# Patient Record
Sex: Male | Born: 1965 | Race: Black or African American | Hispanic: No | Marital: Single | Smoking: Current some day smoker
Health system: Southern US, Community
[De-identification: ages and names within clinical notes are randomized; demographics above are authoritative.]

## PROBLEM LIST (undated history)

## (undated) DIAGNOSIS — I5022 Chronic systolic (congestive) heart failure: Secondary | ICD-10-CM

## (undated) DIAGNOSIS — Z720 Tobacco use: Secondary | ICD-10-CM

## (undated) DIAGNOSIS — I10 Essential (primary) hypertension: Secondary | ICD-10-CM

## (undated) DIAGNOSIS — I255 Ischemic cardiomyopathy: Secondary | ICD-10-CM

## (undated) DIAGNOSIS — E785 Hyperlipidemia, unspecified: Secondary | ICD-10-CM

## (undated) DIAGNOSIS — I2510 Atherosclerotic heart disease of native coronary artery without angina pectoris: Secondary | ICD-10-CM

## (undated) DIAGNOSIS — F1010 Alcohol abuse, uncomplicated: Secondary | ICD-10-CM

## (undated) HISTORY — DX: Chronic systolic (congestive) heart failure: I50.22

## (undated) HISTORY — PX: CORONARY ANGIOPLASTY WITH STENT PLACEMENT: SHX49

## (undated) HISTORY — DX: Essential (primary) hypertension: I10

---

## 2012-05-29 LAB — CBC
HGB: 15 g/dL (ref 13.0–18.0)
MCH: 30.5 pg (ref 26.0–34.0)
MCHC: 33.1 g/dL (ref 32.0–36.0)
Platelet: 243 10*3/uL (ref 150–440)
RDW: 14.2 % (ref 11.5–14.5)

## 2012-05-29 LAB — COMPREHENSIVE METABOLIC PANEL
Alkaline Phosphatase: 89 U/L (ref 50–136)
Anion Gap: 5 — ABNORMAL LOW (ref 7–16)
Bilirubin,Total: 0.4 mg/dL (ref 0.2–1.0)
Co2: 29 mmol/L (ref 21–32)
Creatinine: 1.12 mg/dL (ref 0.60–1.30)
EGFR (African American): >60
Osmolality: 277 (ref 275–301)
Potassium: 3.4 mmol/L — ABNORMAL LOW (ref 3.5–5.1)
SGOT(AST): 15 U/L (ref 15–37)
SGPT (ALT): 25 U/L (ref 12–78)
Sodium: 137 mmol/L (ref 136–145)
Total Protein: 8.1 g/dL (ref 6.4–8.2)

## 2012-05-29 LAB — CK TOTAL AND CKMB (NOT AT ARMC)
CK, Total: 343 U/L — ABNORMAL HIGH (ref 35–232)
CK, Total: 451 U/L — ABNORMAL HIGH (ref 35–232)
CK-MB: 0.7 ng/mL (ref 0.5–3.6)
CK-MB: 8.1 ng/mL — ABNORMAL HIGH (ref 0.5–3.6)

## 2012-05-29 LAB — DRUG SCREEN, URINE
Barbiturates, Ur Screen: NEGATIVE (ref ?–200)
Benzodiazepine, Ur Scrn: NEGATIVE (ref ?–200)
Cannabinoid 50 Ng, Ur ~~LOC~~: NEGATIVE (ref ?–50)
Methadone, Ur Screen: NEGATIVE (ref ?–300)
Opiate, Ur Screen: POSITIVE (ref ?–300)
Phencyclidine (PCP) Ur S: NEGATIVE (ref ?–25)

## 2012-05-30 ENCOUNTER — Inpatient Hospital Stay: Payer: Self-pay | Admitting: Internal Medicine

## 2012-05-30 ENCOUNTER — Inpatient Hospital Stay (HOSPITAL_COMMUNITY)
Admission: EM | Admit: 2012-05-30 | Discharge: 2012-06-01 | DRG: 247 | Disposition: A | Discharge status: Home / Self Care | Payer: MEDICAID | Source: Other Acute Inpatient Hospital | Attending: Cardiovascular Disease | Admitting: Cardiovascular Disease

## 2012-05-30 ENCOUNTER — Encounter (HOSPITAL_COMMUNITY): Payer: Self-pay | Admitting: Cardiology

## 2012-05-30 ENCOUNTER — Encounter (HOSPITAL_COMMUNITY)
Admission: EM | Disposition: A | Discharge status: Home / Self Care | Payer: Self-pay | Source: Other Acute Inpatient Hospital | Attending: Cardiovascular Disease

## 2012-05-30 DIAGNOSIS — E785 Hyperlipidemia, unspecified: Secondary | ICD-10-CM | POA: Diagnosis present

## 2012-05-30 DIAGNOSIS — I214 Non-ST elevation (NSTEMI) myocardial infarction: Principal | ICD-10-CM | POA: Diagnosis present

## 2012-05-30 DIAGNOSIS — Z720 Tobacco use: Secondary | ICD-10-CM | POA: Diagnosis present

## 2012-05-30 DIAGNOSIS — I2510 Atherosclerotic heart disease of native coronary artery without angina pectoris: Secondary | ICD-10-CM

## 2012-05-30 DIAGNOSIS — I472 Ventricular tachycardia, unspecified: Secondary | ICD-10-CM | POA: Diagnosis present

## 2012-05-30 DIAGNOSIS — I2589 Other forms of chronic ischemic heart disease: Secondary | ICD-10-CM | POA: Diagnosis present

## 2012-05-30 DIAGNOSIS — I2109 ST elevation (STEMI) myocardial infarction involving other coronary artery of anterior wall: Secondary | ICD-10-CM

## 2012-05-30 DIAGNOSIS — F17200 Nicotine dependence, unspecified, uncomplicated: Secondary | ICD-10-CM | POA: Diagnosis present

## 2012-05-30 DIAGNOSIS — F1010 Alcohol abuse, uncomplicated: Secondary | ICD-10-CM | POA: Diagnosis present

## 2012-05-30 DIAGNOSIS — Z955 Presence of coronary angioplasty implant and graft: Secondary | ICD-10-CM

## 2012-05-30 DIAGNOSIS — I369 Nonrheumatic tricuspid valve disorder, unspecified: Secondary | ICD-10-CM

## 2012-05-30 DIAGNOSIS — I4729 Other ventricular tachycardia: Secondary | ICD-10-CM | POA: Diagnosis present

## 2012-05-30 HISTORY — DX: Tobacco use: Z72.0

## 2012-05-30 HISTORY — PX: LEFT HEART CATHETERIZATION WITH CORONARY ANGIOGRAM: SHX5451

## 2012-05-30 HISTORY — DX: Hyperlipidemia, unspecified: E78.5

## 2012-05-30 HISTORY — DX: Atherosclerotic heart disease of native coronary artery without angina pectoris: I25.10

## 2012-05-30 HISTORY — DX: Alcohol abuse, uncomplicated: F10.10

## 2012-05-30 HISTORY — DX: Ischemic cardiomyopathy: I25.5

## 2012-05-30 LAB — APTT: Activated PTT: 112.2 secs — ABNORMAL HIGH (ref 23.6–35.9)

## 2012-05-30 LAB — CK: CK, Total: 1928 U/L — ABNORMAL HIGH (ref 35–232)

## 2012-05-30 LAB — MRSA PCR SCREENING: MRSA by PCR: NEGATIVE

## 2012-05-30 SURGERY — LEFT HEART CATHETERIZATION WITH CORONARY ANGIOGRAM
Anesthesia: LOCAL | Laterality: Bilateral

## 2012-05-30 MED ORDER — FENTANYL CITRATE 0.05 MG/ML IJ SOLN
INTRAMUSCULAR | Status: AC
  Filled 2012-05-30: qty 2

## 2012-05-30 MED ORDER — VERAPAMIL HCL 2.5 MG/ML IV SOLN
INTRAVENOUS | Status: AC
  Filled 2012-05-30: qty 2

## 2012-05-30 MED ORDER — LIDOCAINE HCL (PF) 1 % IJ SOLN
INTRAMUSCULAR | Status: AC
  Filled 2012-05-30: qty 30

## 2012-05-30 MED ORDER — MIDAZOLAM HCL 2 MG/2ML IJ SOLN
INTRAMUSCULAR | Status: AC
  Filled 2012-05-30: qty 2

## 2012-05-30 MED ORDER — ONDANSETRON HCL 4 MG/2ML IJ SOLN: 4.0000 mg | Freq: Four times a day (QID) | INTRAMUSCULAR | Status: DC | PRN

## 2012-05-30 MED ORDER — PRASUGREL HCL 10 MG PO TABS
ORAL_TABLET | ORAL | Status: AC
  Filled 2012-05-30: qty 6

## 2012-05-30 MED ORDER — CARVEDILOL 3.125 MG PO TABS
3.1250 mg | ORAL_TABLET | Freq: Two times a day (BID) | ORAL | Status: DC
  Administered 2012-05-31 – 2012-06-01 (×3): 3.125 mg via ORAL
  Filled 2012-05-30 (×6): qty 1

## 2012-05-30 MED ORDER — ACETAMINOPHEN 325 MG PO TABS: 650.0000 mg | ORAL_TABLET | ORAL | Status: DC | PRN

## 2012-05-30 MED ORDER — FAMOTIDINE IN NACL 20-0.9 MG/50ML-% IV SOLN
INTRAVENOUS | Status: AC
  Filled 2012-05-30: qty 50

## 2012-05-30 MED ORDER — ACETAMINOPHEN 325 MG PO TABS
650.0000 mg | ORAL_TABLET | ORAL | Status: DC | PRN
Start: 2012-05-30 — End: 2012-05-30

## 2012-05-30 MED ORDER — SODIUM CHLORIDE 0.9 % IJ SOLN: 3.0000 mL | INTRAMUSCULAR | Status: DC | PRN

## 2012-05-30 MED ORDER — NITROGLYCERIN 0.4 MG SL SUBL: 0.4000 mg | SUBLINGUAL_TABLET | SUBLINGUAL | Status: DC | PRN

## 2012-05-30 MED ORDER — NITROGLYCERIN 0.2 MG/ML ON CALL CATH LAB
INTRAVENOUS | Status: AC
  Filled 2012-05-30: qty 1

## 2012-05-30 MED ORDER — DIAZEPAM 5 MG PO TABS
5.0000 mg | ORAL_TABLET | ORAL | Status: DC | PRN
  Administered 2012-05-30: 5 mg via ORAL
  Filled 2012-05-30: qty 1

## 2012-05-30 MED ORDER — SODIUM CHLORIDE 0.9 % IJ SOLN
3.0000 mL | Freq: Two times a day (BID) | INTRAMUSCULAR | Status: DC
  Administered 2012-05-30 – 2012-06-01 (×4): 3 mL via INTRAVENOUS

## 2012-05-30 MED ORDER — BIVALIRUDIN 250 MG IV SOLR
INTRAVENOUS | Status: AC
  Filled 2012-05-30: qty 250

## 2012-05-30 MED ORDER — OXYCODONE-ACETAMINOPHEN 5-325 MG PO TABS: 1.0000 | ORAL_TABLET | ORAL | Status: DC | PRN

## 2012-05-30 MED ORDER — SODIUM CHLORIDE 0.9 % IV SOLN
1.7500 mg/kg/h | INTRAVENOUS | Status: DC
  Filled 2012-05-30: qty 250

## 2012-05-30 MED ORDER — PRASUGREL HCL 10 MG PO TABS
10.0000 mg | ORAL_TABLET | Freq: Every day | ORAL | Status: DC
  Administered 2012-05-31 – 2012-06-01 (×2): 10 mg via ORAL
  Filled 2012-05-30 (×2): qty 1

## 2012-05-30 MED ORDER — SODIUM CHLORIDE 0.9 % IV SOLN
1.0000 mL/kg/h | INTRAVENOUS | Status: AC
  Administered 2012-05-30: 1 mL/kg/h via INTRAVENOUS

## 2012-05-30 MED ORDER — ATORVASTATIN CALCIUM 80 MG PO TABS
80.0000 mg | ORAL_TABLET | Freq: Every day | ORAL | Status: DC
  Administered 2012-05-30 – 2012-06-01 (×3): 80 mg via ORAL
  Filled 2012-05-30 (×3): qty 1

## 2012-05-30 MED ORDER — SODIUM CHLORIDE 0.9 % IV SOLN: 250.0000 mL | INTRAVENOUS | Status: DC

## 2012-05-30 MED ORDER — HEPARIN (PORCINE) IN NACL 2-0.9 UNIT/ML-% IJ SOLN
INTRAMUSCULAR | Status: AC
  Filled 2012-05-30: qty 1500

## 2012-05-30 NOTE — CV Procedure (Signed)
Cardiac Catheterization Procedure Note  Name: Travis Morton MRN: [id number] DOB: 1966/01/07  Procedure: Left Heart Cath, Selective Coronary Angiography, LV angiography, PTCA and stenting of the proximal LAD, PTCA of the first diagonal  Indication: NSTEMI, dynamic EKG changes, markedly elevated cardiac enzymes  Procedural Details:  The right wrist was prepped, draped, and anesthetized with 1% lidocaine. Using the modified Seldinger technique, a 6 French sheath was introduced into the right radial artery. 3 mg of verapamil was administered through the sheath, weight-based bivalirudin was administered intravenously. Angiomax was administered for anticoagulation. Standard Judkins catheters were used for selective coronary angiography and left ventriculography. Catheter exchanges were performed over an exchange length guidewire.  PROCEDURAL FINDINGS Hemodynamics: AO 92/62  LV 96/22   Coronary angiography: Coronary dominance: right  Left mainstem: Patent without obstructive disease. There is mild stenosis involving the mid left main.  Left anterior descending (LAD): There is a complex severe stenosis of the proximal LAD. There is a napkin ring stenosis estimated at 50% followed by a critical 95% eccentric stenosis over the segment of LAD involving the origin of the first diagonal. The first diagonal branch has high-grade 95% ostial stenosis. The mid and distal LAD have minor nonobstructive disease in the vessel wraps around the left ventricular apex. There is an outpouching from the proximal LAD lesion that likely represents an ulcerated plaque.  Left circumflex (LCx): Large-caliber vessel with diffuse disease. There is an eccentric 50% stenosis distally just before the origin of 2 posterolateral branches. The proximal circumflex has minor nonobstructive disease.  Right coronary artery (RCA): Total occlusion of the proximal RCA with bridging collaterals. There is competitive filling distally  from left to right collaterals. The vessel has the appearance of the chronic total occlusion.  Left ventriculography: The basal segments are hyperdynamic. The mid anterior, apical, and inferoapical walls are akinetic. The left ventricular ejection fraction is estimated at 35%.  PCI Note:  Following the diagnostic procedure, the decision was made to proceed with PCI. The patient has been on intravenous heparin. He received Plavix 600 mg last night. Angiomax was started after radial access was obtained. He is aspirin allergic. Since he was given the treated with monotherapy, I elected to load him with effient 60 mg and he will be continued on effient thereafter. Once a therapeutic ACT was achieved, a 6 [place] XB LAD 3.5 cm guide catheter was inserted.  A pro-water coronary guidewire was used to cross the lesion.  The lesion was predilated with a 2.5x15 mm balloon.  The lesion was then stented with a 3.0x1mm Promus Premier drug-eluting stent so that there was full coverage of the critical lesion as well as the proximal stenosis that would have been within 5 mm of the proximal stent border. The stent was deployed at 12 atm. this compromised flow into the diagonal branch. The diagonal was wired with a whisper wire. The stent was postdilated with a 3.25 x 20 mm noncompliant balloon to 16 atmospheres on 2 inflations.  The diagonal was ballooned with a 2.0 x 12 mm balloon to 8 atmospheres. Simultaneous kissing balloons were done. Following PCI, there was 0% residual stenosis and TIMI-3 flow in the LAD and 40% residual stenosis at the diagonal ostium with TIMI 3 flow. Final angiography confirmed an excellent result. The patient tolerated the procedure well. There were no immediate procedural complications. A TR band was used for radial hemostasis. The patient was transferred to the post catheterization recovery area for further monitoring.  PCI Data: Lesion 1:  Vessel - LAD/Segment - proximal Percent Stenosis (pre)   95 TIMI-flow 3 Stent 3.0 x 32 mm drug-eluting Percent Stenosis (post) 0 TIMI-flow (post) 3  Lesion 2: Vessel - diagonal/Segment - ostium Percent Stenosis (pre)  100 TIMI-flow 0 Stent none Percent Stenosis (post) 40 TIMI-flow (post) 3  Final Conclusions:   1. Critical complex LAD stenosis with successful percutaneous intervention 2. 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 Moderate left circumflex stenosis 4. Severe LV dysfunction  Recommendations:  Post MI medical therapy. Patient will require aggressive risk reduction. Will check an echo in 48 hours.  Tonny Bollman 05/30/2012, 12:48 PM

## 2012-05-30 NOTE — H&P (Signed)
Patient ID: Travis Morton MRN: [id number] DOB/AGE: 45-Mar-1967 45 y.o. Admit date: 05/30/2012  Primary Care Physician:No primary provider on file. Primary Cardiologist: Dr Kirke Corin Active Problems:  Acute myocardial infarction, subendocardial infarction, initial episode of care  Tobacco abuse  HPI: 47 year old gentleman presenting in transfer from [facility]. The patient presented there last night with chest pain and dynamic EKG changes. He ruled in for myocardial infarction, but became chest pain-free after initial medical therapy. He was evaluated this morning and transferred for cardiac catheterization and possible PCI. The patient has no past cardiac history. He has no medical problems. He is a cigarette smoker. He admits to an episode of chest pain about 3 weeks ago but this was transient. Yesterday he developed severe ongoing substernal chest pain. When he was treated with nitroglycerin and heparin, his symptoms resolved. His cardiac enzymes were noted to be markedly elevated. He had transient hyperacute T waves anteriorly and LAD territory event was suspected.  He presents now directly to the cardiac cath lab. He is chest pain-free. I have reviewed all of his medical records from [place] with no further additions to make.  The patient has no past history of medical problems. He's never been hospitalized. He's not been treated for hypertension, diabetes, or dyslipidemia. He takes no medications. He was allergic to aspirin. His father has a history of stroke but there is no family history of premature coronary artery disease.  No past medical history on file.  No past surgical history on file.  No family history on file.   History   Social History  . Marital Status: Unknown    Spouse Name: N/A    Number of Children: N/A  . Years of Education: N/A   Occupational History  . Not on file.   Social History Main Topics  . Smoking status: Not on file  .  Smokeless tobacco: Not on file  . Alcohol Use: Not on file  . Drug Use: Not on file  . Sexually Active: Not on file   Other Topics Concern  . Not on file   Social History Narrative  . No narrative on file     No prescriptions prior to admission    General: no fevers/chills/night sweats/weight loss Eyes: no blurry vision, diplopia, or amaurosis ENT: no sore throat or hearing loss Resp: no cough, wheezing, or hemoptysis CV: no edema or palpitations GI: no abdominal pain, nausea, vomiting, diarrhea, or constipation GU: no dysuria, frequency, or hematuria Skin: no rash Neuro: no headache, numbness, tingling, or weakness of extremities Musculoskeletal: no joint pain or swelling Heme: no bleeding, DVT, or easy bruising Endo: no polydipsia or polyuria  Physical Exam: Blood pressure 104/69, pulse 49, resp. rate 13, height 5\' 11"  (1.803 m), weight 86.183 kg (190 lb), SpO2 98.00%.   Pt is alert and oriented, WD, WN, in no distress. HEENT: normal Neck: JVP normal. Carotid upstrokes normal without bruits. No thyromegaly. Lungs: equal expansion, clear bilaterally CV: Apex is discrete and nondisplaced, RRR without murmur or gallop Abd: soft, NT, +BS, no bruit, no hepatosplenomegaly Back: no CVA tenderness Ext: no C/C/E        Femoral pulses 2+= without bruits        DP/PT pulses intact and = Skin: warm and dry without rash Neuro: CNII-XII intact             Strength intact = bilaterally  Labs:   No results found for this basename: WBC,  HGB,  HCT,  MCV,  PLT   No results found for this basename: NA,K,CL,CO2,BUN,CREATININE,CALCIUM,LABALBU,PROT,BILITOT,ALKPHOS,ALT,AST,GLUCOSE in the last 168 hours No results found for this basename: CKTOTAL,  CKMB,  CKMBINDEX,  TROPONINI    No results found for this basename: CHOL   No results found for this basename: HDL   No results found for this basename: LDLCALC   No results found for this basename: TRIG   No results found for this  basename: CHOLHDL   No results found for this basename: LDLDIRECT      Radiology: No results found.  EKG: Initial EKG reviewed and shows normal sinus rhythm with hyperacute T waves suggestive of early injury, heart rate 81 beats per minute.  ASSESSMENT AND PLAN:  1. Acute non-ST elevation MI. Patient with high risk features of dynamic EKG changes and elevated cardiac enzymes. Hyperacute T waves suggest LAD territory at risk. Patient now transferred here for cardiac catheterization and possible PCI. I have reviewed the risks, indications, and alternatives to cardiac catheterization and possible PCI. The patient understands and agrees to proceed. He has been appropriately loaded with 600 mg clopidogrel. Since he is aspirin allergic, will consider change to effient depending on his coronary anatomy.  2. Tobacco abuse. Cessation counseling will be done.  3. Lipids. Will check fasting lipids tomorrow morning and start on high-dose statin drug because of his acute coronary syndrome.  Disposition: Pending cardiac catheterization results.  Signed: Tonny Bollman 05/30/2012, 1:11 PM

## 2012-05-31 DIAGNOSIS — I2109 ST elevation (STEMI) myocardial infarction involving other coronary artery of anterior wall: Secondary | ICD-10-CM

## 2012-05-31 LAB — MAGNESIUM: Magnesium: 2 mg/dL (ref 1.5–2.5)

## 2012-05-31 LAB — CBC
MCH: 29.9 pg (ref 26.0–34.0)
Platelets: 204 10*3/uL (ref 150–400)
RBC: 4.55 MIL/uL (ref 4.22–5.81)
RDW: 13.9 % (ref 11.5–15.5)
WBC: 11.2 10*3/uL — ABNORMAL HIGH (ref 4.0–10.5)

## 2012-05-31 LAB — BASIC METABOLIC PANEL
CO2: 29 mEq/L (ref 19–32)
Calcium: 9.3 mg/dL (ref 8.4–10.5)
GFR calc Af Amer: 84 mL/min — ABNORMAL LOW (ref >90)
Sodium: 137 mEq/L (ref 135–145)

## 2012-05-31 LAB — LIPID PANEL
Cholesterol: 202 mg/dL — ABNORMAL HIGH (ref 0–200)
Total CHOL/HDL Ratio: 4.1 RATIO

## 2012-05-31 LAB — TROPONIN I: Troponin I: >20 ng/mL (ref <0.30)

## 2012-05-31 MED ORDER — VITAMIN B-1 100 MG PO TABS
100.0000 mg | ORAL_TABLET | Freq: Every day | ORAL | Status: DC
  Administered 2012-05-31 – 2012-06-01 (×2): 100 mg via ORAL
  Filled 2012-05-31 (×2): qty 1

## 2012-05-31 MED ORDER — POTASSIUM CHLORIDE CRYS ER 20 MEQ PO TBCR
20.0000 meq | EXTENDED_RELEASE_TABLET | Freq: Once | ORAL | Status: AC
  Administered 2012-05-31: 20 meq via ORAL
  Filled 2012-05-31: qty 1

## 2012-05-31 MED ORDER — THIAMINE HCL 100 MG/ML IJ SOLN
100.0000 mg | Freq: Every day | INTRAMUSCULAR | Status: DC
  Filled 2012-05-31: qty 1

## 2012-05-31 MED ORDER — LORAZEPAM 1 MG PO TABS: 1.0000 mg | ORAL_TABLET | Freq: Four times a day (QID) | ORAL | Status: DC | PRN

## 2012-05-31 MED ORDER — FOLIC ACID 1 MG PO TABS
1.0000 mg | ORAL_TABLET | Freq: Every day | ORAL | Status: DC
  Administered 2012-05-31 – 2012-06-01 (×2): 1 mg via ORAL
  Filled 2012-05-31 (×2): qty 1

## 2012-05-31 MED ORDER — LORAZEPAM 1 MG PO TABS: 0.0000 mg | ORAL_TABLET | Freq: Two times a day (BID) | ORAL | Status: DC

## 2012-05-31 MED ORDER — CAPTOPRIL 6.25 MG HALF TABLET
6.2500 mg | ORAL_TABLET | Freq: Three times a day (TID) | ORAL | Status: DC
  Administered 2012-05-31 (×2): 6.25 mg via ORAL
  Filled 2012-05-31 (×5): qty 1

## 2012-05-31 MED ORDER — LORAZEPAM 2 MG/ML IJ SOLN: 1.0000 mg | Freq: Four times a day (QID) | INTRAMUSCULAR | Status: DC | PRN

## 2012-05-31 MED ORDER — ADULT MULTIVITAMIN W/MINERALS CH
1.0000 | ORAL_TABLET | Freq: Every day | ORAL | Status: DC
  Administered 2012-05-31 – 2012-06-01 (×2): 1 via ORAL
  Filled 2012-05-31 (×2): qty 1

## 2012-05-31 MED ORDER — LORAZEPAM 1 MG PO TABS: 0.0000 mg | ORAL_TABLET | Freq: Four times a day (QID) | ORAL | Status: DC

## 2012-05-31 MED FILL — Dextrose Inj 5%: INTRAVENOUS | Qty: 50 | Status: AC

## 2012-05-31 NOTE — Progress Notes (Signed)
CARDIAC REHAB PHASE I   PRE:  Rate/Rhythm: 60SR  BP:  Supine: 109/63  Sitting:   Standing:    SaO2: 98%RA  MODE:  Ambulation: 700 ft   POST:  Rate/Rhythem: 73SR  BP:  Supine:   Sitting: 106/65  Standing:    SaO2: 95%RA 1025-1118 Pt walked 700 ft with steady gait. Tolerated well. No Cp. Started ed. Gave smoking cessation handouts and encouraged pt to call 1800quitnow as needed. Gave MI booklet and stent booklet. Pt voiced understanding of ed done. Needs diet,ex and Phase 2 at next visit. To recliner after walk.  Travis Morton

## 2012-05-31 NOTE — Care Management Note (Unsigned)
    Page 1 of 1   06/01/2012     4:07:25 PM   CARE MANAGEMENT NOTE 06/01/2012  Patient:  Travis Morton, Travis Morton   Account Number:  [id number]  Date Initiated:  05/31/2012  Documentation initiated by:  Nathasha Fiorillo  Subjective/Objective Assessment:   PT S/P NSTEMI S/P PCI ON 05/30/12.  PTA, PT INDEPENDENT OF ADLS.     Action/Plan:   MET WITH PT TO DISCUSS DC NEEDS. PT HAS NO INSURANCE, AND IS TO DC ON EFFIENT.  WILL GIVE FREE 30 DAY TRIAL CARD.  PT IS ELIGIBLE FOR [place] MATCH PROGRAM FOR ASSISTANCE WITH OTHER DC MEDS.   Anticipated DC Date:  06/01/2012   Anticipated DC Plan:  HOME/SELF CARE      DC Planning Services  CM consult  Medication Assistance  MATCH Program      Choice offered to / List presented to:             Status of service:  In process, will continue to follow Medicare Important Message given?   (If response is "NO", the following Medicare IM given date fields will be blank) Date Medicare IM given:   Date Additional Medicare IM given:    Discharge Disposition:  HOME/SELF CARE  Per UR Regulation:  Reviewed for med. necessity/level of care/duration of stay  If discussed at Long Length of Stay Meetings, dates discussed:    Comments:  06/01/12 Marvel Mcphillips,RN,BSN [phone] ECHO DONE TODAY; PT WILL NEED LIFEVEST.  PLAN TO BE FITTED LATER TODAY, PER BEDSIDE RN.  EFFIENT ASSISTANCE FORM COMPLETE.  PT'S GIRLFRIEND TO BRING IN PAYSTUB TO FAX WITH COMPLETED APPLICATION.  05/31/12 Cristofher Livecchi,RN,BSN [phone] MD/PA:  RECOMMEND USE OF [employer] $4 GENERICS, AS PT HAS NO INSURANCE AND IS UNEMPLOYED CURRENTLY.

## 2012-05-31 NOTE — Progress Notes (Signed)
Utilization Review Completed.Travis Morton T1/06/2012   

## 2012-05-31 NOTE — Progress Notes (Signed)
Patient: Travis Morton / Admit Date: 05/30/2012 / Date of Encounter: 05/31/2012, 8:15 AM   Subjective  Feels "100% better." No CP or SOB. No dizziness. Feels well.   Objective   Telemetry: NSR/SB, PVC couplet, 7 beat NSVT (1/1 afternoon) EKG with SB, marked anterolateral ST changes with profound TWI especially in V3 up to 11mm, minimal residual ST elevation V2 -reviewed with Dr. Excell Seltzer Physical Exam: Filed Vitals:   05/31/12 0810  BP: 100/60  Pulse: 75  Temp: 98.4 F (36.9 C)  Resp: 17   General: Well developed, well nourished AAM in no acute distress. Head: Normocephalic, atraumatic, sclera non-icteric, no xanthomas, nares are without discharge. Neck: Negative for carotid bruits. JVD not elevated. Lungs: Clear bilaterally to auscultation without wheezes, rales, or rhonchi. Breathing is unlabored. Heart: RRR S1 S2 without murmurs, rubs, or gallops.  Abdomen: Soft, non-tender, non-distended with normoactive bowel sounds. No hepatomegaly. No rebound/guarding. No obvious abdominal masses. Msk:  Strength and tone appear normal for age. Extremities: No clubbing or cyanosis. No edema.  Distal pedal pulses are 2+ and equal bilaterally. Radial wrist site still with occlusive bandage but no obvious hematoma or oozing. Neuro: Alert and oriented X 3. Moves all extremities spontaneously. Psych:  Responds to questions appropriately with a normal affect.    Intake/Output Summary (Last 24 hours) at 05/31/12 0815 Last data filed at 05/30/12 2200  Gross per 24 hour  Intake   1254 ml  Output    900 ml  Net    354 ml    Inpatient Medications:    . atorvastatin  80 mg Oral q1800  . carvedilol  3.125 mg Oral BID WC  . prasugrel  10 mg Oral Daily  . sodium chloride  3 mL Intravenous Q12H    Labs:  [facility] 05/31/12 0523  NA 137  K 3.5  CL 101  CO2 29  GLUCOSE 93  BUN 10  CREATININE 1.19  CALCIUM 9.3  MG --  PHOS --    Basename 05/31/12 0523  WBC 11.2*  NEUTROABS --  HGB  13.6  HCT 41.1  MCV 90.3  PLT 204    Basename 05/31/12 0523  CKTOTAL --  CKMB --  TROPONINI >20.00*    Basename 05/31/12 0523  CHOL 202*  HDL 49  LDLCALC 121*  TRIG 160*  CHOLHDL 4.1    Radiology/Studies:  CXR - no acute CP abnormality   Assessment and Plan  1. Acute anterior NSTEMI/CAD s/p PTCA/DES to LAD, PTCA first diagonal, CTO RCA with L-R collaterals, mod LCx stenosis - continue BB, statin, Effient. Care management consult given no insurance.  2. ICM EF 35% - repeat echo pending tomorrow. Will try to add ACE at very low-dose. Continue low-dose carvedilol.  3. NSVT - continue BB therapy. Give KCl today and follow labs in AM. Check Mg. If EF still low on follow-up echocardiogram, may be candidate for lifevest. 4. Aspirin allergy - tolerating Effient. 5. EtOH abuse - pt reports drinking 1-1.5 cases of beer per week. No signs to suggest withdrawal. Add CIWA protocol with MVI and thiamine. He states he is done with EtOH and tobacco. Check CMET in AM. 6. Hyperlipidemia - statin added. LFTs in AM. F/u labs 6-8 weeks.  Signed, Ronie Spies PA-C  Patient seen, examined. Available data reviewed. Agree with findings, assessment, and plan as outlined by Ronie Spies, PA-C. Will transfer patient to tele today. Plan add low - dose captopril as above. If he tolerates will transition to lisinopril  tomorrow. Continue Effient 10 mg. Echocardiogram in the morning.  Tonny Bollman, M.D. 05/31/2012 10:06 AM

## 2012-06-01 ENCOUNTER — Encounter: Payer: Self-pay | Admitting: *Deleted

## 2012-06-01 ENCOUNTER — Encounter (HOSPITAL_COMMUNITY): Payer: Self-pay | Admitting: Cardiology

## 2012-06-01 ENCOUNTER — Telehealth: Payer: Self-pay

## 2012-06-01 DIAGNOSIS — I214 Non-ST elevation (NSTEMI) myocardial infarction: Secondary | ICD-10-CM

## 2012-06-01 LAB — COMPREHENSIVE METABOLIC PANEL
ALT: 19 U/L (ref 0–53)
AST: 39 U/L — ABNORMAL HIGH (ref 0–37)
Alkaline Phosphatase: 69 U/L (ref 39–117)
CO2: 29 mEq/L (ref 19–32)
Calcium: 9.2 mg/dL (ref 8.4–10.5)
Chloride: 102 mEq/L (ref 96–112)
GFR calc Af Amer: 78 mL/min — ABNORMAL LOW (ref >90)
GFR calc non Af Amer: 67 mL/min — ABNORMAL LOW (ref >90)
Glucose, Bld: 92 mg/dL (ref 70–99)
Potassium: 3.8 mEq/L (ref 3.5–5.1)
Sodium: 140 mEq/L (ref 135–145)
Total Bilirubin: 0.7 mg/dL (ref 0.3–1.2)

## 2012-06-01 LAB — CBC
HCT: 41.1 % (ref 39.0–52.0)
Hemoglobin: 13.7 g/dL (ref 13.0–17.0)
MCHC: 33.3 g/dL (ref 30.0–36.0)
RBC: 4.55 MIL/uL (ref 4.22–5.81)

## 2012-06-01 LAB — MAGNESIUM: Magnesium: 2 mg/dL (ref 1.5–2.5)

## 2012-06-01 MED ORDER — PRASUGREL HCL 10 MG PO TABS: 10.0000 mg | ORAL_TABLET | Freq: Every day | ORAL | Status: DC

## 2012-06-01 MED ORDER — CARVEDILOL 3.125 MG PO TABS: 3.1250 mg | ORAL_TABLET | Freq: Two times a day (BID) | ORAL | Status: DC

## 2012-06-01 MED ORDER — LISINOPRIL 5 MG PO TABS: 5.0000 mg | ORAL_TABLET | Freq: Every day | ORAL | Status: DC

## 2012-06-01 MED ORDER — SIMVASTATIN 40 MG PO TABS: 40.0000 mg | ORAL_TABLET | Freq: Every evening | ORAL | Status: DC

## 2012-06-01 MED ORDER — LISINOPRIL 5 MG PO TABS
5.0000 mg | ORAL_TABLET | Freq: Every day | ORAL | Status: DC
  Administered 2012-06-01: 5 mg via ORAL
  Filled 2012-06-01: qty 1

## 2012-06-01 MED ORDER — NITROGLYCERIN 0.4 MG SL SUBL: 0.4000 mg | SUBLINGUAL_TABLET | SUBLINGUAL | Status: DC | PRN

## 2012-06-01 NOTE — Discharge Summary (Signed)
Discharge Summary   Patient ID: Travis Morton MRN: [id number], DOB/AGE: January 24, 1966 47 y.o.  Primary MD: None Primary Cardiologist: Dr. Kirke Corin in [place] Admit date: 05/30/2012 D/C date:     06/01/2012      Primary Discharge Diagnoses:  1. Acute Anterior NSTEMI/CAD  - Newly diagnosed CAD this admission  - Cardiac cath showed critical complex LAD stenosis, chronic total occlusion of RCA w/ L-R collaterals, moderate LCx stenosis, and severe LV dysfunction; s/p PTCA/DES to LAD, PTCA first diagonal  - Dc'd on Effient, Coreg, Lisinopril, Zocor (**ASA allergic)  2. Ischemic Cardiomyopathy, EF 30%  - Lifevest at discharge  - Repeat echo in 3 months  3. Hyperlipidemia  - Initiated on Zocor (due to affordability)  - F/u lipids/LFTs in 6 weeks  4. Tobacco Abuse  5. ETOH Abuse   Allergies Allergies  Allergen Reactions  . Aspirin Hives    Diagnostic Studies/Procedures:   05/30/12 - Cardiac Cath Hemodynamics:  AO 92/62  LV 96/22  Coronary angiography:  Coronary dominance: right  Left mainstem: Patent without obstructive disease. There is mild stenosis involving the mid left main.  Left anterior descending (LAD): There is a complex severe stenosis of the proximal LAD. There is a napkin ring stenosis estimated at 50% followed by a critical 95% eccentric stenosis over the segment of LAD involving the origin of the first diagonal. The first diagonal branch has high-grade 95% ostial stenosis. The mid and distal LAD have minor nonobstructive disease in the vessel wraps around the left ventricular apex. There is an outpouching from the proximal LAD lesion that likely represents an ulcerated plaque.  Left circumflex (LCx): Large-caliber vessel with diffuse disease. There is an eccentric 50% stenosis distally just before the origin of 2 posterolateral branches. The proximal circumflex has minor nonobstructive disease.  Right coronary artery (RCA): Total occlusion of the proximal RCA with  bridging collaterals. There is competitive filling distally from left to right collaterals. The vessel has the appearance of the chronic total occlusion.  Left ventriculography: The basal segments are hyperdynamic. The mid anterior, apical, and inferoapical walls are akinetic. The left ventricular ejection fraction is estimated at 35%.  PCI Data:  Lesion 1:  Vessel - LAD/Segment - proximal  Percent Stenosis (pre) 95  TIMI-flow 3  Stent 3.0 x 32 mm drug-eluting  Percent Stenosis (post) 0  TIMI-flow (post) 3  Lesion 2:  Vessel - diagonal/Segment - ostium  Percent Stenosis (pre) 100  TIMI-flow 0  Stent none  Percent Stenosis (post) 40  TIMI-flow (post) 3  Final Conclusions:  1. Critical complex LAD stenosis with successful percutaneous intervention  2. 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 Moderate left circumflex stenosis  4. Severe LV dysfunction  Recommendations: Post MI medical therapy. Patient will require aggressive risk reduction. Will check an echo in 48 hours  06/01/12 - Echo Study Conclusions: - Left ventricle: The cavity size was normal. Wall thickness was increased in a pattern of mild LVH. The anterior and anteroseptal walls were moderate to severely hypokinetic. The anterolateral wall was hypokinetic. The apical septal and apical inferior walls were akinetic. The true apex was poorly visualized. The estimated ejection fraction was 30%. Features are consistent with a pseudonormal left ventricular filling pattern, with concomitant abnormal relaxation and increased filling pressure (grade 2 diastolic dysfunction). - Aortic valve: There was no stenosis. - Mitral valve: Trivial regurgitation. - Right ventricle: The cavity size was normal. Systolic function was normal. - Tricuspid valve: Peak  RV-RA gradient:31mm Hg (S). - Pulmonary arteries: PA peak pressure: 25mm Hg (S). - Inferior vena cava: The vessel was normal in size; the  respirophasic diameter changes were in the normal range (=50%); findings are consistent with normal central venous pressure. Impressions: Normal LV size with mild LV hypertrophy. EF 30% with wall motion abnormalities noted above. Moderate diastolic dysfunction. Normal RV size and systolic function    History of Present Illness: 47 y.o. male w/ the above medical problems who transferred from [facility] to [facility] on 05/30/12. He initially presented with chest pain that resolved with NTG and heparin. He ruled in for MI and EKG showed hyperacute T waves anteriorly for which he was transferred to [facility] cath lab for possible PCI.  Hospital Course: Cardiac cath showed critical complex LAD stenosis, chronic total occlusion of the RCA w/ L-R collaterals, moderate LCx stenosis, and severe LV dysfunction (EF 35%). He underwent successful PTCA/DES to the LAD and PTCA to the first diagonal. He tolerated the procedure well without complications. Recommendations were made for aggressive risk reduction and antiplatelet therapy with Effient alone as he is ASA allergic. He was initiated on low dose BB and ACEI as well as a statin. Case management provided him with 30 day free effient card as well as mediation assistance information. Efforts were made to prescribe affordable medications. Cath site remained stable and he ambulated with cardiac rehab without chest pain or shortness of breath. Echo showed EF 30%, WMAs as above, mod diastolic dysfunction, and normal RV size/systolic function. Given his low EF he will be discharged with a lifevest for primary prevention of SCD. He was instructed against driving until cleared by cardiology.  He was seen and evaluated by Dr. Excell Seltzer who felt he was stable for discharge home with plans for follow up as scheduled below.  Discharge Vitals: Blood pressure 103/64, pulse 59, temperature 98.5 F (36.9 C), temperature source Oral, resp. rate 18, height 5\' 11"  (1.803  m), weight 179 lb 3.7 oz (81.3 kg), SpO2 100.00%.  Labs: Component Value Date   WBC 8.6 06/01/2012   HGB 13.7 06/01/2012   HCT 41.1 06/01/2012   MCV 90.3 06/01/2012   PLT 205 06/01/2012    Lab 06/01/12 0605  NA 140  K 3.8  CL 102  CO2 29  BUN 16  CREATININE 1.27  CALCIUM 9.2  PROT 6.6  BILITOT 0.7  ALKPHOS 69  ALT 19  AST 39*  GLUCOSE 92   Basename 05/31/12 0523  TROPONINI >20.00*   Component Value Date   CHOL 202* 05/31/2012   HDL 49 05/31/2012   LDLCALC 161* 05/31/2012   TRIG 160* 05/31/2012     Discharge Medications     Medication List     As of 06/01/2012  2:00 PM    TAKE these medications         carvedilol 3.125 MG tablet   Commonly known as: COREG   Take 1 tablet (3.125 mg total) by mouth 2 (two) times daily with a meal.      lisinopril 5 MG tablet   Commonly known as: PRINIVIL,ZESTRIL   Take 1 tablet (5 mg total) by mouth daily.      nitroGLYCERIN 0.4 MG SL tablet   Commonly known as: NITROSTAT   Place 1 tablet (0.4 mg total) under the tongue every 5 (five) minutes as needed for chest pain (up to 3 doses).      prasugrel 10 MG Tabs   Commonly known as:  EFFIENT   Take 1 tablet (10 mg total) by mouth daily.      simvastatin 40 MG tablet   Commonly known as: ZOCOR   Take 1 tablet (40 mg total) by mouth every evening.          Disposition   Discharge Orders    Future Appointments: Provider: Department: Dept Phone: Center:   06/06/2012 8:30 AM Ok Anis, NP Selena Batten at [facility] [phone] [id number]     Future Orders Please Complete By Expires   Amb Referral to Cardiac Rehabilitation      Comments:   Referring to [place] Phase 2   Diet - low sodium heart healthy      Increase activity slowly      Discharge instructions      Comments:   * Please take all medications as prescribed and bring them with you to your office visits. * NO DRIVING until cleared by cardiology  * KEEP WRIST CATHETERIZATION SITE CLEAN AND DRY. Call the  office for any signs of bleeding, pus, swelling, increased pain, or any other concerns. * NO HEAVY LIFTING (>10lbs) X 2 WEEKS. * NO SEXUAL ACTIVITY X 2 WEEKS. * NO SOAKING BATHS, HOT TUBS, POOLS, ETC., X 5 DAYS.  * Please avoid tobacco and alcohol consumption.  * You were started on a cholesterol medication (Zocor) this hospitalization and will need follow up blood work (lipid panel and liver function tests) in 6-8wks   * Please establish care with a primary care provider     Follow-up Information    Follow up with Nicolasa Ducking, NP. On 06/06/2012. (8:30)    Contact information:   [facility] [address] [place] [address] [phone]          Outstanding Labs/Studies:  1. Lipid panel and LFTs in 6 weeks 2. Follow up echo 3 months   Duration of Discharge Encounter: Greater than 30 minutes including physician and PA time.  Signed, Jury Caserta PA-C 06/01/2012, 2:00 PM

## 2012-06-01 NOTE — Telephone Encounter (Signed)
Message copied by [facility], Shirleymae Hauth E on Fri Jun 01, 2012  4:15 PM ------      Message from: Kendrick Fries      Created: Fri Jun 01, 2012  4:07 PM       TCM      Discharged [facility]: 05/30/2012      Appointment: Ward Givens 06/06/2012.      Records Printed.

## 2012-06-01 NOTE — Progress Notes (Addendum)
Pt discharged to home with girlfriend. Pt's two IVs were taken out and gauze applied. Pt's dressing removed from right wrist. Bandaid applied. Site level 0. RN reviewed discharged instructons and home medications with pt. Pt verbalized understanding. Pt was given prescriptions for medications. RN faxed application for [employer] Patient Assistance Program along with pt's girlfriend's pay stub. Pt given original copies.   Alfonso Ellis, RN

## 2012-06-01 NOTE — Progress Notes (Signed)
CARDIAC REHAB PHASE I   PRE:  Rate/Rhythm: 58SB  BP:  Supine:   Sitting: 102/70  Standing:    SaO2:   MODE:  Ambulation: 1100 ft   POST:  Rate/Rhythem: 71  BP:  Supine:   Sitting: 106/80  Standing:    SaO2: 97%RA 0850-0935 Pt walked 1100 ft on RA with steady gait. Tolerated well. Education completed including diet, daily weights, signs of CHF and when to call MD. Discussed importance of watching sodium. Permission given to refer to [facility] Phase 2.  Duanne Limerick

## 2012-06-01 NOTE — Progress Notes (Signed)
  Echocardiogram 2D Echocardiogram has been performed.  Ellender Hose A 06/01/2012, 11:41 AM

## 2012-06-01 NOTE — Telephone Encounter (Signed)
TCM pt Still admitted in [facility] Will reassess for d/c 1/6

## 2012-06-01 NOTE — Progress Notes (Signed)
    Subjective:  Feels good. No chest pain or dyspnea.  Objective:  Vital Signs in the last 24 hours: Temp:  [98.4 F (36.9 C)-99.1 F (37.3 C)] 98.5 F (36.9 C) (01/03 0628) Pulse Rate:  [56-59] 59  (01/03 0628) Resp:  [18] 18  (01/03 0628) BP: (101-122)/(54-68) 103/66 mmHg (01/03 0628) SpO2:  [97 %-100 %] 100 % (01/03 0628) Weight:  [81.3 kg (179 lb 3.7 oz)] 81.3 kg (179 lb 3.7 oz) (01/03 0628)  Intake/Output from previous day: 01/02 0701 - 01/03 0700 In: 240 [P.O.:240] Out: 300 [Urine:300]  Physical Exam: Pt is alert and oriented, NAD HEENT: normal Neck: JVP - normal, carotids 2+= without bruits Lungs: CTA bilaterally CV: RRR without murmur or gallop Abd: soft, NT, Positive BS, no hepatomegaly Ext: no C/C/E, distal pulses intact and equal Skin: warm/dry no rash  Lab Results:  Basename 06/01/12 0605 05/31/12 0523  WBC 8.6 11.2*  HGB 13.7 13.6  PLT 205 204    Basename 06/01/12 0605 05/31/12 0523  NA 140 137  K 3.8 3.5  CL 102 101  CO2 29 29  GLUCOSE 92 93  BUN 16 10  CREATININE 1.27 1.19    Basename 05/31/12 0523  TROPONINI >20.00*    Cardiac Studies: 2D Echo pending  Tele: Sinus rhythm, no significant arrhythmia, personally reviewed.  Assessment/Plan:  1. Anterior STEMI 2. Hyperlipidemia - LDL 121 3. Tobacco abuse 4. Etoh   Pt tolerated addition of captopril yesterday. Will change to lisinopril today. 2D Echo to be done this am. Would discharge on Effient 10 mg with 30 day card and assistance paperwork, lisinopril 5 mg, simvastatin 40 mg, and carvedilol 3.125 mg BID. He is ASA allergic.We have discussed tobacco and Etoh cessation and he is motivated to quit. He should follow-up with Dr Kirke Corin within 2 weeks in the [place] office.  Tonny Bollman, M.D. 06/01/2012, 7:56 AM

## 2012-06-04 NOTE — Telephone Encounter (Signed)
TCM attempt #1 No answer

## 2012-06-05 NOTE — Telephone Encounter (Signed)
TCM attempt #2 No answer

## 2012-06-06 ENCOUNTER — Encounter: Payer: Self-pay | Admitting: Nurse Practitioner

## 2012-06-06 ENCOUNTER — Ambulatory Visit (INDEPENDENT_AMBULATORY_CARE_PROVIDER_SITE_OTHER): Payer: 59 | Admitting: Nurse Practitioner

## 2012-06-06 VITALS — BP 130/84 | HR 61 | Ht 72.0 in | Wt 187.0 lb

## 2012-06-06 DIAGNOSIS — I255 Ischemic cardiomyopathy: Secondary | ICD-10-CM

## 2012-06-06 DIAGNOSIS — I214 Non-ST elevation (NSTEMI) myocardial infarction: Secondary | ICD-10-CM

## 2012-06-06 DIAGNOSIS — I2510 Atherosclerotic heart disease of native coronary artery without angina pectoris: Secondary | ICD-10-CM

## 2012-06-06 NOTE — Patient Instructions (Addendum)
Your physician wants you to follow-up in: 1 month. You will receive a reminder letter in the mail two months in advance. If you don't receive a letter, please call our office to schedule the follow-up appointment.  Your physician recommends that you return for lab work in: 6 weeks. Please make sure you are fasting for this lab work.  Your physician has requested that you have an echocardiogram in 3 months.. Echocardiography is a painless test that uses sound waves to create images of your heart. It provides your doctor with information about the size and shape of your heart and how well your heart's chambers and valves are working. This procedure takes approximately one hour. There are no restrictions for this procedure.

## 2012-06-06 NOTE — Progress Notes (Signed)
Patient Name: Travis Morton Date of Encounter: 06/06/2012  Primary Care Provider:  None Primary Cardiologist:  M. Kirke Corin, MD  Patient Profile  47 year old male with recent anterior MI who presents for followup.  Problem List   Past Medical History  Diagnosis Date  . Coronary artery disease       a. 05/2012 NSTEMI/Cath/PCI: critical complex LAD stenosis, chronic total occlusion of RCA w/ L-R collaterals, moderate LCx stenosis, and severe LV dysfunction -->DES to LAD (3.0x31mm Promus Premier), PTCA first diagonal;   . Ischemic cardiomyopathy     a. 05/2012 Echo: EF 30%, grade 2 diastolic dysfunction, severe anterior and anteroseptal hypokinesis, akinesis of the apical septal and apical inferior walls.  . Hyperlipidemia   . Tobacco abuse   . ETOH abuse    Past Surgical History  Procedure Date  . Coronary angioplasty with stent placement     05/2012 s/p PTCA/DES to LAD, PTCA first diagonal   Allergies  Allergies  Allergen Reactions  . Aspirin Hives   HPI  47 year old male with the above problem list. He was recently admitted to [facility] and subsequently transferred to [facility] for management of non-ST segment elevation myocardial infarction. [facility] revealing severe LAD and diagonal disease as well as a chronic total occlusion of the right coronary artery. The LAD was treated with a drug-eluting stent while the diagonal was treated with balloon angioplasty. Followup echocardiogram showed an EF of 30%. He was maintained on Plavix, beta blocker, ACE inhibitor, and statin therapy. He has an aspirin allergy and is therefore not on aspirin. Given low EF, he was felt to be high risk for sudden cardiac death and a life vest was placed. Since his discharge, he has done well. He has quit smoking and drinking. He reports compliance with his medications. He has not been wearing his life vest however. He does not on a scale and thus has not been  weighing himself. He denies chest pain, PND, orthopnea, dyspnea on exertion, edema, presyncope, syncope, or early satiety.  Home Medications  Prior to Admission medications   Medication Sig Start Date End Date Taking? Authorizing Provider  carvedilol (COREG) 3.125 MG tablet Take 1 tablet (3.125 mg total) by mouth 2 (two) times daily with a meal. 06/01/12  Yes Jessica A Hope, PA-C  lisinopril (PRINIVIL,ZESTRIL) 5 MG tablet Take 1 tablet (5 mg total) by mouth daily. 06/01/12  Yes Jessica A Hope, PA-C  nitroGLYCERIN (NITROSTAT) 0.4 MG SL tablet Place 1 tablet (0.4 mg total) under the tongue every 5 (five) minutes as needed for chest pain (up to 3 doses). 06/01/12  Yes Jessica A Hope, PA-C  prasugrel (EFFIENT) 10 MG TABS Take 1 tablet (10 mg total) by mouth daily. 06/01/12  Yes Jessica A Hope, PA-C  simvastatin (ZOCOR) 40 MG tablet Take 1 tablet (40 mg total) by mouth every evening. 06/01/12  Yes Jessica A Hope, PA-C   Review of Systems  He denies c/p, doe, pnd, orthopnea, n, v, dizziness, syncope, edema, weight gain, or early satiety. All other systems reviewed and are otherwise negative except as noted above.  Physical Exam  Blood pressure 130/84, pulse 61, height 6' (1.829 m), weight 187 lb (84.823 kg).  BP 108/78 on repeat. General: Pleasant, NAD Psych: Normal affect. Neuro: Alert and oriented X 3. Moves all extremities spontaneously. HEENT: Normal  Neck: Supple without bruits or JVD. Lungs:  Resp regular and unlabored, CTA. Heart: RRR no s3, s4, or murmurs. Abdomen: Soft,  non-tender, non-distended, BS + x 4.  Extremities: No clubbing, cyanosis or edema. DP/PT/Radials 2+ and equal bilaterally.  Accessory Clinical Findings  ECG - regular sinus rhythm, 61, deep anterior T-wave inversion with T wave inversion in 1 and aVL as well. No acute ST or T changes when compared with ECG from January 2.  Assessment & Plan  1. Coronary artery disease: He is status post PCI and stenting of the LAD with  PTCA of the diagonal. He has not had any chest pain. He remains on Plavix, beta blocker, ACE inhibitor, and statin therapy.  He is allergic to aspirin.  We'll make no changes to his medications today.  I have filled out paperwork for effient assistance as he is not sure if he ever received this at hospital discharge or if he did he is not sure where it is or if he is already sent in.  2. Ischemic cardiomyopathy: He is euvolemic on exam. He has not had significant dyspnea on exertion. We discussed the importance of wearing his life vest as well as future plans for 2-D echocardiogram in approximately 3 months to reevaluate LV function and determine his candidacy for ICD placement. I don't think he fully understood his risk of sudden cardiac death. He remains on beta blocker and ACE inhibitor therapy. Initially I thought it would be able to titrate his ACE inhibitor however I repeated his blood pressure it was 108/78.  3. Hyperlipidemia: Continue statin therapy. He is on Zocor 40 mg because it was felt that he would not be able to afford Lipitor 80 or Crestor 40. He will need followup lipids and LFTs in approximately 6 more weeks.  4. Tobacco abuse and alcohol abuse: He reports that he has quit both.  5. Disposition: I will have him followup in one month to keep close tabs on him.  Nicolasa Ducking, NP 06/06/2012, 10:29 AM

## 2012-06-13 ENCOUNTER — Encounter: Payer: Self-pay | Admitting: Nurse Practitioner

## 2012-06-28 ENCOUNTER — Encounter: Payer: Self-pay | Admitting: *Deleted

## 2012-07-01 DIAGNOSIS — I214 Non-ST elevation (NSTEMI) myocardial infarction: Secondary | ICD-10-CM

## 2012-07-01 DIAGNOSIS — I2510 Atherosclerotic heart disease of native coronary artery without angina pectoris: Secondary | ICD-10-CM

## 2012-07-01 DIAGNOSIS — I2589 Other forms of chronic ischemic heart disease: Secondary | ICD-10-CM

## 2012-07-09 ENCOUNTER — Ambulatory Visit: Payer: Self-pay | Admitting: Cardiovascular Disease

## 2012-07-13 ENCOUNTER — Ambulatory Visit: Payer: Self-pay | Admitting: Cardiovascular Disease

## 2012-07-19 ENCOUNTER — Other Ambulatory Visit: Payer: Self-pay

## 2012-07-20 ENCOUNTER — Encounter: Payer: Self-pay | Admitting: Cardiovascular Disease

## 2012-07-20 ENCOUNTER — Ambulatory Visit (INDEPENDENT_AMBULATORY_CARE_PROVIDER_SITE_OTHER): Payer: Self-pay

## 2012-07-20 ENCOUNTER — Ambulatory Visit (INDEPENDENT_AMBULATORY_CARE_PROVIDER_SITE_OTHER): Payer: Self-pay | Admitting: Cardiovascular Disease

## 2012-07-20 VITALS — BP 122/86 | HR 57 | Ht 72.0 in | Wt 187.0 lb

## 2012-07-20 DIAGNOSIS — E785 Hyperlipidemia, unspecified: Secondary | ICD-10-CM | POA: Insufficient documentation

## 2012-07-20 NOTE — Patient Instructions (Addendum)
Continue same medications.   Schedule echo in 2 months.   Your physician has requested that you have an echocardiogram. Echocardiography is a painless test that uses sound waves to create images of your heart. It provides your doctor with information about the size and shape of your heart and how well your heart's chambers and valves are working. This procedure takes approximately one hour. There are no restrictions for this procedure.  You can resume work on 07/28/2012  Follow up after echo.

## 2012-07-20 NOTE — Assessment & Plan Note (Addendum)
He has no symptoms suggestive of angina. Continue medical therapy. He is on Effient monotherapy given that he is allergic to aspirin. I advised him to start an exercise program but to avoid weight lifting. I instructed him to start slowly and build up his endurance. He can resume work on March 1.

## 2012-07-20 NOTE — Assessment & Plan Note (Signed)
He is currently New York Heart Association class II. There is no evidence of fluid overload. Continue treatment with carvedilol and lisinopril. The dosage of these medications cannot be increased at this time due to bradycardia and relatively low blood pressure with complaints of dizziness. I discussed with him the risk of sudden cardiac death. However, he expressed his wishes not to wear the life rest. I will request an echocardiogram to be done in 2 months from now to reevaluate his LV systolic function and see if an ICD is indicated.

## 2012-07-20 NOTE — Progress Notes (Signed)
HPI  47 year old male who is here today for a followup visit regarding coronary artery disease and ischemic cardiomyopathy.  He was admitted on 05/30/2012 to [facility] and subsequently transferred to [facility] for management of non-ST segment elevation myocardial infarction. [facility] revealing severe LAD and diagonal disease as well as a chronic total occlusion of the right coronary artery. The LAD was treated with a drug-eluting stent while the diagonal was treated with balloon angioplasty. Followup echocardiogram showed an EF of 30%. He has an aspirin allergy and is therefore not on aspirin. Given low EF, he was felt to be high risk for sudden cardiac death and a life vest was placed. Since his discharge, he has done well. He has quit smoking and drinking. He reports compliance with his medications.  He has not been wearing a life vest and does not want to. I explained to him the risk of sudden cardiac death. He feels significantly better from before. He has no exertional chest pain. He does complain of positional chest discomfort. He has mild exertional dyspnea.  Allergies  Allergen Reactions  . Aspirin Hives     Current Outpatient Prescriptions on File Prior to Visit  Medication Sig Dispense Refill  . carvedilol (COREG) 3.125 MG tablet Take 1 tablet (3.125 mg total) by mouth 2 (two) times daily with a meal.  60 tablet  6  . lisinopril (PRINIVIL,ZESTRIL) 5 MG tablet Take 1 tablet (5 mg total) by mouth daily.  30 tablet  6  . nitroGLYCERIN (NITROSTAT) 0.4 MG SL tablet Place 1 tablet (0.4 mg total) under the tongue every 5 (five) minutes as needed for chest pain (up to 3 doses).  25 tablet  3  . prasugrel (EFFIENT) 10 MG TABS Take 1 tablet (10 mg total) by mouth daily.  30 tablet  6  . simvastatin (ZOCOR) 40 MG tablet Take 1 tablet (40 mg total) by mouth every evening.  30 tablet  6   No current facility-administered medications on file prior  to visit.     Past Medical History  Diagnosis Date  . Coronary artery disease       a. 05/2012 NSTEMI/Cath/PCI: critical complex LAD stenosis, chronic total occlusion of RCA w/ L-R collaterals, moderate LCx stenosis, and severe LV dysfunction -->DES to LAD (3.0x75mm Promus Premier), PTCA first diagonal;   . Ischemic cardiomyopathy     a. 05/2012 Echo: EF 30%, grade 2 diastolic dysfunction, severe anterior and anteroseptal hypokinesis, akinesis of the apical septal and apical inferior walls.  . Hyperlipidemia   . Tobacco abuse   . ETOH abuse      Past Surgical History  Procedure Laterality Date  . Coronary angioplasty with stent placement      05/2012 s/p PTCA/DES to LAD, PTCA first diagonal     Family History  Problem Relation Age of Onset  . Hypertension Mother   . Hypertension Father      History   Social History  . Marital Status: Unknown    Spouse Name: N/A    Number of Children: N/A  . Years of Education: N/A   Occupational History  . Not on file.   Social History Main Topics  . Smoking status: Former Smoker -- 0.50 packs/day for 24 years    Types: Cigarettes    Quit date: 06/01/2012  . Smokeless tobacco: Not on file  . Alcohol Use: 7.2 oz/week    12 Cans of beer per week  Comment: daily  . Drug Use: Yes    Special: Cocaine     Comment: past  . Sexually Active: Not on file   Other Topics Concern  . Not on file   Social History Narrative  . No narrative on file      PHYSICAL EXAM   BP 122/86  Pulse 57  Ht 6' (1.829 m)  Wt 187 lb (84.823 kg)  BMI 25.36 kg/m2  Constitutional: He is oriented to person, place, and time. He appears well-developed and well-nourished. No distress.  HENT: No nasal discharge.  Head: Normocephalic and atraumatic.  Eyes: Pupils are equal and round. Right eye exhibits no discharge. Left eye exhibits no discharge.  Neck: Normal range of motion. Neck supple. No JVD present. No thyromegaly present.  Cardiovascular:  Normal rate, regular rhythm, normal heart sounds and. Exam reveals no gallop and no friction rub. No murmur heard.  Pulmonary/Chest: Effort normal and breath sounds normal. No stridor. No respiratory distress. He has no wheezes. He has no rales. He exhibits no tenderness.  Abdominal: Soft. Bowel sounds are normal. He exhibits no distension. There is no tenderness. There is no rebound and no guarding.  Musculoskeletal: Normal range of motion. He exhibits no edema and no tenderness.  Neurological: He is alert and oriented to person, place, and time. Coordination normal.  Skin: Skin is warm and dry. No rash noted. He is not diaphoretic. No erythema. No pallor.  Psychiatric: He has a normal mood and affect. His behavior is normal. Judgment and thought content normal.      EKG: Normal sinus rhythm with old anteroseptal infarct   ASSESSMENT AND PLAN

## 2012-07-20 NOTE — Assessment & Plan Note (Signed)
Continue treatment with simvastatin. He had fasting lipid and liver profile checked today.

## 2012-07-21 LAB — HEPATIC FUNCTION PANEL
ALT: 20 IU/L (ref 0–44)
AST: 19 IU/L (ref 0–40)
Albumin: 4.5 g/dL (ref 3.5–5.5)
Alkaline Phosphatase: 84 IU/L (ref 39–117)

## 2012-07-21 LAB — LIPID PANEL
Cholesterol, Total: 144 mg/dL (ref 100–199)
HDL: 48 mg/dL (ref >39)
LDL Calculated: 70 mg/dL (ref 0–99)
VLDL Cholesterol Cal: 26 mg/dL (ref 5–40)

## 2012-07-23 ENCOUNTER — Encounter: Payer: Self-pay | Admitting: *Deleted

## 2012-08-30 ENCOUNTER — Telehealth: Payer: Self-pay | Admitting: Cardiovascular Disease

## 2012-08-30 NOTE — Telephone Encounter (Signed)
New problem   Did you receive renewal form for life vest for pt. Please advise rep

## 2012-09-04 ENCOUNTER — Other Ambulatory Visit: Payer: Self-pay

## 2012-09-06 ENCOUNTER — Other Ambulatory Visit: Payer: Self-pay

## 2012-09-07 NOTE — Telephone Encounter (Signed)
No renewel--I have asked Marisue Ivan and she will see if she can get me a reorder

## 2012-09-20 ENCOUNTER — Other Ambulatory Visit: Payer: Self-pay

## 2012-10-09 ENCOUNTER — Other Ambulatory Visit (INDEPENDENT_AMBULATORY_CARE_PROVIDER_SITE_OTHER): Payer: Self-pay

## 2012-10-09 ENCOUNTER — Other Ambulatory Visit: Payer: Self-pay

## 2012-10-09 DIAGNOSIS — I214 Non-ST elevation (NSTEMI) myocardial infarction: Secondary | ICD-10-CM

## 2012-10-11 NOTE — Progress Notes (Signed)
x2 could not leave vm not set up. Will try again to contact about echo.

## 2012-10-12 NOTE — Progress Notes (Signed)
Pt informed of echo result.

## 2012-11-05 ENCOUNTER — Encounter: Payer: Self-pay | Admitting: Cardiovascular Disease

## 2012-11-05 NOTE — Telephone Encounter (Signed)
Samples of Effient 10mg 

## 2012-11-05 NOTE — Telephone Encounter (Signed)
samples

## 2012-11-07 ENCOUNTER — Telehealth: Payer: Self-pay

## 2012-11-07 MED ORDER — PRASUGREL HCL 10 MG PO TABS: 10.0000 mg | ORAL_TABLET | Freq: Every day | ORAL | Status: DC

## 2012-11-07 NOTE — Telephone Encounter (Signed)
Have called pt x2 unable to leave VM to notify pt that we currently do not have any zetia samples waiting for Rep to bring [place] some.

## 2012-11-07 NOTE — Telephone Encounter (Signed)
This encounter was created in error - please disregard.

## 2012-11-07 NOTE — Telephone Encounter (Signed)
I advised pt to remain on Effient at least until next OV with Dr. Kirke Corin He wanted to see pt back after echo. There was no appt made Scheduled for 6/27 at 1045 Pt informed samples of Effient left at [facility]

## 2012-11-07 NOTE — Telephone Encounter (Signed)
Pt wants to know if he needs to still take effient, and if so, do we have samples.

## 2012-11-23 ENCOUNTER — Ambulatory Visit: Payer: Self-pay | Admitting: Cardiovascular Disease

## 2012-12-07 ENCOUNTER — Ambulatory Visit (INDEPENDENT_AMBULATORY_CARE_PROVIDER_SITE_OTHER): Payer: Self-pay | Admitting: Cardiovascular Disease

## 2012-12-07 ENCOUNTER — Encounter: Payer: Self-pay | Admitting: Cardiovascular Disease

## 2012-12-07 VITALS — BP 108/78 | HR 58 | Ht 72.0 in | Wt 180.5 lb

## 2012-12-07 DIAGNOSIS — E785 Hyperlipidemia, unspecified: Secondary | ICD-10-CM

## 2012-12-07 DIAGNOSIS — I2510 Atherosclerotic heart disease of native coronary artery without angina pectoris: Secondary | ICD-10-CM

## 2012-12-07 DIAGNOSIS — I255 Ischemic cardiomyopathy: Secondary | ICD-10-CM

## 2012-12-07 DIAGNOSIS — I2589 Other forms of chronic ischemic heart disease: Secondary | ICD-10-CM

## 2012-12-07 NOTE — Assessment & Plan Note (Signed)
He has no symptoms suggestive of angina. He is to continue Effient for one year. Given that he is allergic to aspirin, I plan to switch him to Plavix after one year.

## 2012-12-07 NOTE — Progress Notes (Signed)
HPI  47 year old male who is here today for a followup visit regarding coronary artery disease and ischemic cardiomyopathy.  He was admitted on 05/30/2012 to [place] [facility] and subsequently transferred to [facility] for management of non-ST segment elevation myocardial infarction. [facility] revealing severe LAD and diagonal disease as well as a chronic total occlusion of the right coronary artery. The LAD was treated with a drug-eluting stent while the diagonal was treated with balloon angioplasty. Followup echocardiogram while hospitalized showed an EF of 30%. He has an aspirin allergy and is therefore not on aspirin.  He quit smoking and drinking since his MI. He has been doing very well and denies any chest pain, dyspnea, palpitations or syncope. He had a repeat echocardiogram in May of 2014 which showed normal LV systolic function.  Allergies  Allergen Reactions  . Aspirin Hives     Current Outpatient Prescriptions on File Prior to Visit  Medication Sig Dispense Refill  . carvedilol (COREG) 3.125 MG tablet Take 1 tablet (3.125 mg total) by mouth 2 (two) times daily with a meal.  60 tablet  6  . lisinopril (PRINIVIL,ZESTRIL) 5 MG tablet Take 1 tablet (5 mg total) by mouth daily.  30 tablet  6  . nitroGLYCERIN (NITROSTAT) 0.4 MG SL tablet Place 1 tablet (0.4 mg total) under the tongue every 5 (five) minutes as needed for chest pain (up to 3 doses).  25 tablet  3  . prasugrel (EFFIENT) 10 MG TABS Take 1 tablet (10 mg total) by mouth daily.  28 tablet  0  . simvastatin (ZOCOR) 40 MG tablet Take 1 tablet (40 mg total) by mouth every evening.  30 tablet  6   No current facility-administered medications on file prior to visit.     Past Medical History  Diagnosis Date  . Coronary artery disease       a. 05/2012 NSTEMI/Cath/PCI: critical complex LAD stenosis, chronic total occlusion of RCA w/ L-R collaterals, moderate LCx stenosis, and severe LV  dysfunction -->DES to LAD (3.0x28mm Promus Premier), PTCA first diagonal;   . Ischemic cardiomyopathy     a. 05/2012 Echo: EF 30%, grade 2 diastolic dysfunction, severe anterior and anteroseptal hypokinesis, akinesis of the apical septal and apical inferior walls.  . Tobacco abuse   . ETOH abuse   . Hyperlipidemia      Past Surgical History  Procedure Laterality Date  . Coronary angioplasty with stent placement      05/2012 s/p PTCA/DES to LAD, PTCA first diagonal     Family History  Problem Relation Age of Onset  . Hypertension Mother   . Hypertension Father      History   Social History  . Marital Status: Unknown    Spouse Name: N/A    Number of Children: N/A  . Years of Education: N/A   Occupational History  . Not on file.   Social History Main Topics  . Smoking status: Former Smoker -- 0.50 packs/day for 24 years    Types: Cigarettes    Quit date: 06/01/2012  . Smokeless tobacco: Not on file  . Alcohol Use: 7.2 oz/week    12 Cans of beer per week     Comment: daily  . Drug Use: Yes    Special: Cocaine     Comment: past  . Sexually Active: Not on file   Other Topics Concern  . Not on file   Social History Narrative  . No narrative on  file      PHYSICAL EXAM   BP 108/78  Pulse 58  Ht 6' (1.829 m)  Wt 180 lb 8 oz (81.874 kg)  BMI 24.47 kg/m2  Constitutional: He is oriented to person, place, and time. He appears well-developed and well-nourished. No distress.  HENT: No nasal discharge.  Head: Normocephalic and atraumatic.  Eyes: Pupils are equal and round. Right eye exhibits no discharge. Left eye exhibits no discharge.  Neck: Normal range of motion. Neck supple. No JVD present. No thyromegaly present.  Cardiovascular: Normal rate, regular rhythm, normal heart sounds and. Exam reveals no gallop and no friction rub. No murmur heard.  Pulmonary/Chest: Effort normal and breath sounds normal. No stridor. No respiratory distress. He has no wheezes. He  has no rales. He exhibits no tenderness.  Abdominal: Soft. Bowel sounds are normal. He exhibits no distension. There is no tenderness. There is no rebound and no guarding.  Musculoskeletal: Normal range of motion. He exhibits no edema and no tenderness.  Neurological: He is alert and oriented to person, place, and time. Coordination normal.  Skin: Skin is warm and dry. No rash noted. He is not diaphoretic. No erythema. No pallor.  Psychiatric: He has a normal mood and affect. His behavior is normal. Judgment and thought content normal.      EKG: Sinus  Bradycardia  - PRi = 118 BORDERLINE RHYTHM   ASSESSMENT AND PLAN

## 2012-12-07 NOTE — Assessment & Plan Note (Signed)
Lab Results  Component Value Date   CHOL 202* 05/31/2012   HDL 48 07/20/2012   LDLCALC 70 07/20/2012   TRIG 128 07/20/2012   CHOLHDL 3.0 07/20/2012   Continue treatment with simvastatin.

## 2012-12-07 NOTE — Patient Instructions (Addendum)
Continue same medications.  Follow up in 6 months.  

## 2012-12-07 NOTE — Assessment & Plan Note (Signed)
His LV systolic function normalized on most recent echocardiogram which is excellent news. Continue current medications.

## 2012-12-24 ENCOUNTER — Other Ambulatory Visit (HOSPITAL_COMMUNITY): Payer: Self-pay | Admitting: Cardiology

## 2012-12-25 ENCOUNTER — Other Ambulatory Visit: Payer: Self-pay

## 2012-12-25 MED ORDER — CARVEDILOL 3.125 MG PO TABS: ORAL_TABLET | ORAL | Status: DC

## 2012-12-25 MED ORDER — LISINOPRIL 5 MG PO TABS: ORAL_TABLET | ORAL | Status: DC

## 2012-12-25 MED ORDER — SIMVASTATIN 40 MG PO TABS: ORAL_TABLET | ORAL | Status: DC

## 2013-01-23 ENCOUNTER — Telehealth: Payer: Self-pay

## 2013-01-23 NOTE — Telephone Encounter (Signed)
Pt states he needs samples of Effient, states if Dr. Mariah Milling wants him to continue to take it, he will need samples. Please call.

## 2013-01-23 NOTE — Telephone Encounter (Signed)
Please Disregard; I saw that pt is to continue Effient for 1 yr at last ov on 12/07/12.

## 2013-01-23 NOTE — Telephone Encounter (Signed)
Placed samples of Effient 10 mg at front desk for patient to pick up.

## 2013-01-23 NOTE — Telephone Encounter (Signed)
Please see note Below

## 2013-02-18 ENCOUNTER — Telehealth: Payer: Self-pay

## 2013-02-18 NOTE — Telephone Encounter (Signed)
Pt given samples Effient 10 mg, 4 bottles, #21.

## 2013-03-12 ENCOUNTER — Telehealth: Payer: Self-pay

## 2013-03-12 NOTE — Telephone Encounter (Signed)
Lmtcb. Placed samples at front desk for pick up.

## 2013-03-12 NOTE — Telephone Encounter (Signed)
Pt would like Effient samples.  

## 2013-04-08 ENCOUNTER — Telehealth: Payer: Self-pay | Admitting: *Deleted

## 2013-04-08 NOTE — Telephone Encounter (Signed)
Needs samples of Effient 10mg 

## 2013-04-08 NOTE — Telephone Encounter (Signed)
Placed samples of effient 10 mg at front desk for pick up.

## 2013-05-03 ENCOUNTER — Telehealth: Payer: Self-pay

## 2013-05-03 ENCOUNTER — Telehealth: Payer: Self-pay | Admitting: *Deleted

## 2013-05-03 NOTE — Telephone Encounter (Signed)
Effient left at front desk. Attepted to contact pt. No voicemail box available.

## 2013-05-03 NOTE — Telephone Encounter (Signed)
Pt would like samples of Effient

## 2013-05-03 NOTE — Telephone Encounter (Signed)
Attempted to return pt call re effient.

## 2013-05-28 ENCOUNTER — Other Ambulatory Visit: Payer: Self-pay | Admitting: *Deleted

## 2013-06-11 ENCOUNTER — Ambulatory Visit: Payer: Self-pay | Admitting: Cardiovascular Disease

## 2013-07-08 ENCOUNTER — Encounter: Payer: Self-pay | Admitting: Cardiovascular Disease

## 2013-07-08 ENCOUNTER — Ambulatory Visit (INDEPENDENT_AMBULATORY_CARE_PROVIDER_SITE_OTHER): Payer: Self-pay | Admitting: Cardiovascular Disease

## 2013-07-08 VITALS — BP 150/88 | HR 54 | Ht 71.0 in | Wt 198.5 lb

## 2013-07-08 DIAGNOSIS — I10 Essential (primary) hypertension: Secondary | ICD-10-CM

## 2013-07-08 DIAGNOSIS — I2510 Atherosclerotic heart disease of native coronary artery without angina pectoris: Secondary | ICD-10-CM

## 2013-07-08 DIAGNOSIS — E785 Hyperlipidemia, unspecified: Secondary | ICD-10-CM

## 2013-07-08 DIAGNOSIS — I255 Ischemic cardiomyopathy: Secondary | ICD-10-CM

## 2013-07-08 DIAGNOSIS — I2589 Other forms of chronic ischemic heart disease: Secondary | ICD-10-CM

## 2013-07-08 MED ORDER — CLOPIDOGREL BISULFATE 75 MG PO TABS: 75.0000 mg | ORAL_TABLET | Freq: Every day | ORAL | Status: DC

## 2013-07-08 MED ORDER — LISINOPRIL 10 MG PO TABS: 10.0000 mg | ORAL_TABLET | Freq: Every day | ORAL | Status: DC

## 2013-07-08 NOTE — Progress Notes (Signed)
HPI  48 year old male who is here today for a followup visit regarding coronary artery disease and ischemic cardiomyopathy.  He was admitted on 05/30/2012 to [facility] and subsequently transferred to [facility] for management of non-ST segment elevation myocardial infarction. [facility] revealing severe LAD and diagonal disease as well as a chronic total occlusion of the right coronary artery. The LAD was treated with a drug-eluting stent while the diagonal was treated with balloon angioplasty. Followup echocardiogram while hospitalized showed an EF of 30%. He has an aspirin allergy and is therefore not on aspirin.  He quit smoking and drinking since his MI. He has been doing very well and denies any chest pain, dyspnea, palpitations or syncope. He had a repeat echocardiogram in May of 2014 which showed normal LV systolic function. He ran out of effient last month.   Allergies  Allergen Reactions  . Aspirin Hives     Current Outpatient Prescriptions on File Prior to Visit  Medication Sig Dispense Refill  . carvedilol (COREG) 3.125 MG tablet TAKE ONE TABLET BY MOUTH TWICE DAILY WITH  A  MEAL  60 tablet  5  . nitroGLYCERIN (NITROSTAT) 0.4 MG SL tablet Place 1 tablet (0.4 mg total) under the tongue every 5 (five) minutes as needed for chest pain (up to 3 doses).  25 tablet  3  . simvastatin (ZOCOR) 40 MG tablet TAKE ONE TABLET BY MOUTH IN THE EVENING  30 tablet  5   No current facility-administered medications on file prior to visit.     Past Medical History  Diagnosis Date  . Coronary artery disease       a. 05/2012 NSTEMI/Cath/PCI: critical complex LAD stenosis, chronic total occlusion of RCA w/ L-R collaterals, moderate LCx stenosis, and severe LV dysfunction -->DES to LAD (3.0x3432mm Promus Premier), PTCA first diagonal;   . Ischemic cardiomyopathy     a. 05/2012 Echo: EF 30%, grade 2 diastolic dysfunction, severe anterior and anteroseptal  hypokinesis, akinesis of the apical septal and apical inferior walls.  . Tobacco abuse   . ETOH abuse   . Hyperlipidemia      Past Surgical History  Procedure Laterality Date  . Coronary angioplasty with stent placement      05/2012 s/p PTCA/DES to LAD, PTCA first diagonal     Family History  Problem Relation Age of Onset  . Hypertension Mother   . Hypertension Father      History   Social History  . Marital Status: Unknown    Spouse Name: N/A    Number of Children: N/A  . Years of Education: N/A   Occupational History  . Not on file.   Social History Main Topics  . Smoking status: Former Smoker -- 0.50 packs/day for 24 years    Types: Cigarettes    Quit date: 06/01/2012  . Smokeless tobacco: Not on file  . Alcohol Use: No     Comment: daily  . Drug Use: Yes    Special: Cocaine     Comment: past  . Sexual Activity: Not on file   Other Topics Concern  . Not on file   Social History Narrative  . No narrative on file      PHYSICAL EXAM   BP 150/88  Pulse 54  Ht 5\' 11"  (1.803 m)  Wt 198 lb 8 oz (90.039 kg)  BMI 27.70 kg/m2  Constitutional: He is oriented to person, place, and time. He appears well-developed and well-nourished. No  distress.  HENT: No nasal discharge.  Head: Normocephalic and atraumatic.  Eyes: Pupils are equal and round. Right eye exhibits no discharge. Left eye exhibits no discharge.  Neck: Normal range of motion. Neck supple. No JVD present. No thyromegaly present.  Cardiovascular: Normal rate, regular rhythm, normal heart sounds and. Exam reveals no gallop and no friction rub. No murmur heard.  Pulmonary/Chest: Effort normal and breath sounds normal. No stridor. No respiratory distress. He has no wheezes. He has no rales. He exhibits no tenderness.  Abdominal: Soft. Bowel sounds are normal. He exhibits no distension. There is no tenderness. There is no rebound and no guarding.  Musculoskeletal: Normal range of motion. He exhibits no  edema and no tenderness.  Neurological: He is alert and oriented to person, place, and time. Coordination normal.  Skin: Skin is warm and dry. No rash noted. He is not diaphoretic. No erythema. No pallor.  Psychiatric: He has a normal mood and affect. His behavior is normal. Judgment and thought content normal.      EKG: Sinus  Bradycardia  - Inferior TW changes.    ASSESSMENT AND PLAN

## 2013-07-08 NOTE — Assessment & Plan Note (Signed)
Continue Simvastatin. 

## 2013-07-08 NOTE — Assessment & Plan Note (Signed)
Increase Lisinopril to 10 mg once daily due to elevated BP. Check BMP in 1 week.

## 2013-07-08 NOTE — Patient Instructions (Signed)
Your physician has recommended you make the following change in your medication:  Increase Lisinopril to 10 mg daily  Start Plavix 75 mg daily   Your physician recommends that you return for lab work in:  1 week  Basic Metabolic Panel   Your physician wants you to follow-up in: 6 months. You will receive a reminder letter in the mail two months in advance. If you don't receive a letter, please call our office to schedule the follow-up appointment.

## 2013-07-08 NOTE — Assessment & Plan Note (Signed)
Doing very well with no recurrent angina. He ran out of Effient and is allergic to Aspirin. I explained to him that he should on an antiplatelet medication lifelong.  I started Plavix 75 mg once daily.

## 2013-07-15 ENCOUNTER — Ambulatory Visit (INDEPENDENT_AMBULATORY_CARE_PROVIDER_SITE_OTHER): Payer: Self-pay | Admitting: *Deleted

## 2013-07-15 ENCOUNTER — Telehealth: Payer: Self-pay | Admitting: *Deleted

## 2013-07-15 DIAGNOSIS — I10 Essential (primary) hypertension: Secondary | ICD-10-CM

## 2013-07-15 NOTE — Telephone Encounter (Signed)
Travis Morton came in for labs today and said that Dr. Kirke CorinArida started him on plavix because he thought he was allergic to baby aspirin. Travis Morton stated he actually is not allergic. He wants to know if can stop Plavix and start aspirin instead.

## 2013-07-15 NOTE — Telephone Encounter (Signed)
Attempted to call patient for details no answer 2/16

## 2013-07-15 NOTE — Telephone Encounter (Signed)
He told us in the past that he was allergic to Aspirin and caused him to have hives. Is that not the case?

## 2013-07-16 LAB — BASIC METABOLIC PANEL
BUN / CREAT RATIO: 19 (ref 9–20)
BUN: 18 mg/dL (ref 6–24)
CO2: 25 mmol/L (ref 18–29)
Calcium: 9.6 mg/dL (ref 8.7–10.2)
Chloride: 101 mmol/L (ref 97–108)
Creatinine, Ser: 0.94 mg/dL (ref 0.76–1.27)
GFR, EST AFRICAN AMERICAN: 113 mL/min/{1.73_m2} (ref >59)
GFR, EST NON AFRICAN AMERICAN: 98 mL/min/{1.73_m2} (ref >59)
Glucose: 90 mg/dL (ref 65–99)
POTASSIUM: 4.3 mmol/L (ref 3.5–5.2)
SODIUM: 143 mmol/L (ref 134–144)

## 2013-07-17 NOTE — Telephone Encounter (Signed)
No answer 2/18

## 2013-07-17 NOTE — Telephone Encounter (Signed)
That is fine 

## 2013-07-17 NOTE — Telephone Encounter (Signed)
Informed patient that per Dr. Kirke CorinArida it is all right to proceed with Aspirin as long as he is not allergic. Patient verbalized understanding.

## 2013-07-17 NOTE — Telephone Encounter (Signed)
Patient said that he took something that he thought was an aspirin and had an allergic reaction and later found out that it was not aspirin. He decided to stop Plavix and start himself on Aspirin 2 weeks ago and he has been fine. Is it all right to continue on the aspirin?

## 2013-07-29 ENCOUNTER — Telehealth: Payer: Self-pay | Admitting: *Deleted

## 2013-07-29 MED ORDER — SIMVASTATIN 40 MG PO TABS: ORAL_TABLET | ORAL | Status: DC

## 2013-07-29 MED ORDER — CARVEDILOL 3.125 MG PO TABS: ORAL_TABLET | ORAL | Status: DC

## 2013-07-29 NOTE — Telephone Encounter (Signed)
Patient called for rx refill 

## 2013-07-29 NOTE — Telephone Encounter (Signed)
Error

## 2013-08-08 ENCOUNTER — Other Ambulatory Visit: Payer: Self-pay

## 2013-08-08 MED ORDER — CARVEDILOL 3.125 MG PO TABS: ORAL_TABLET | ORAL | Status: DC

## 2014-01-06 ENCOUNTER — Other Ambulatory Visit: Payer: Self-pay

## 2014-01-06 MED ORDER — CARVEDILOL 3.125 MG PO TABS: ORAL_TABLET | ORAL | Status: DC

## 2014-01-06 MED ORDER — SIMVASTATIN 40 MG PO TABS: ORAL_TABLET | ORAL | Status: DC

## 2014-02-19 ENCOUNTER — Telehealth: Payer: Self-pay

## 2014-02-19 NOTE — Telephone Encounter (Signed)
Pt states pharmacist states Carvedilol is no longer in stock, requests a stronger dosage to cut in half, or different rx. Please call and advise.

## 2014-02-19 NOTE — Telephone Encounter (Signed)
Pt states pharmacist states Carvedilol is no longer in stock, requests a stronger dosage to cut in half, or different rx

## 2014-02-19 NOTE — Telephone Encounter (Signed)
Ok either way

## 2014-02-20 MED ORDER — CARVEDILOL 6.25 MG PO TABS: 3.1250 mg | ORAL_TABLET | Freq: Two times a day (BID) | ORAL | Status: DC

## 2014-02-20 NOTE — Telephone Encounter (Signed)
LVM to inform patient that new RX has been sent to his pharmacy

## 2014-02-20 NOTE — Telephone Encounter (Signed)
Pt informed

## 2014-05-08 ENCOUNTER — Encounter (HOSPITAL_COMMUNITY): Payer: Self-pay | Admitting: Cardiovascular Disease

## 2014-07-13 ENCOUNTER — Other Ambulatory Visit: Payer: Self-pay | Admitting: Cardiovascular Disease

## 2014-07-15 ENCOUNTER — Other Ambulatory Visit: Payer: Self-pay

## 2014-07-16 ENCOUNTER — Other Ambulatory Visit: Payer: Self-pay | Admitting: *Deleted

## 2014-07-16 MED ORDER — LISINOPRIL 10 MG PO TABS: 10.0000 mg | ORAL_TABLET | Freq: Every day | ORAL | Status: DC

## 2014-07-16 MED ORDER — CARVEDILOL 6.25 MG PO TABS
3.1250 mg | ORAL_TABLET | Freq: Two times a day (BID) | ORAL | Status: DC
Start: 2014-07-16 — End: 2015-05-01

## 2014-07-23 IMAGING — CR DG CHEST 2V
1 series · 2 of 2 positions shown · non-contrast
Comparison: none

REASON FOR EXAM: CP
COMMENTS:

PROCEDURE:     DXR - DXR CHEST PA (OR AP) AND LATERAL  - May 29, 2012  [DATE]
RESULT:     Comparison: None.

[Series 1: w chest pa · 0.14mm/px · 2 of 2 slices shown]
[im 1/2]
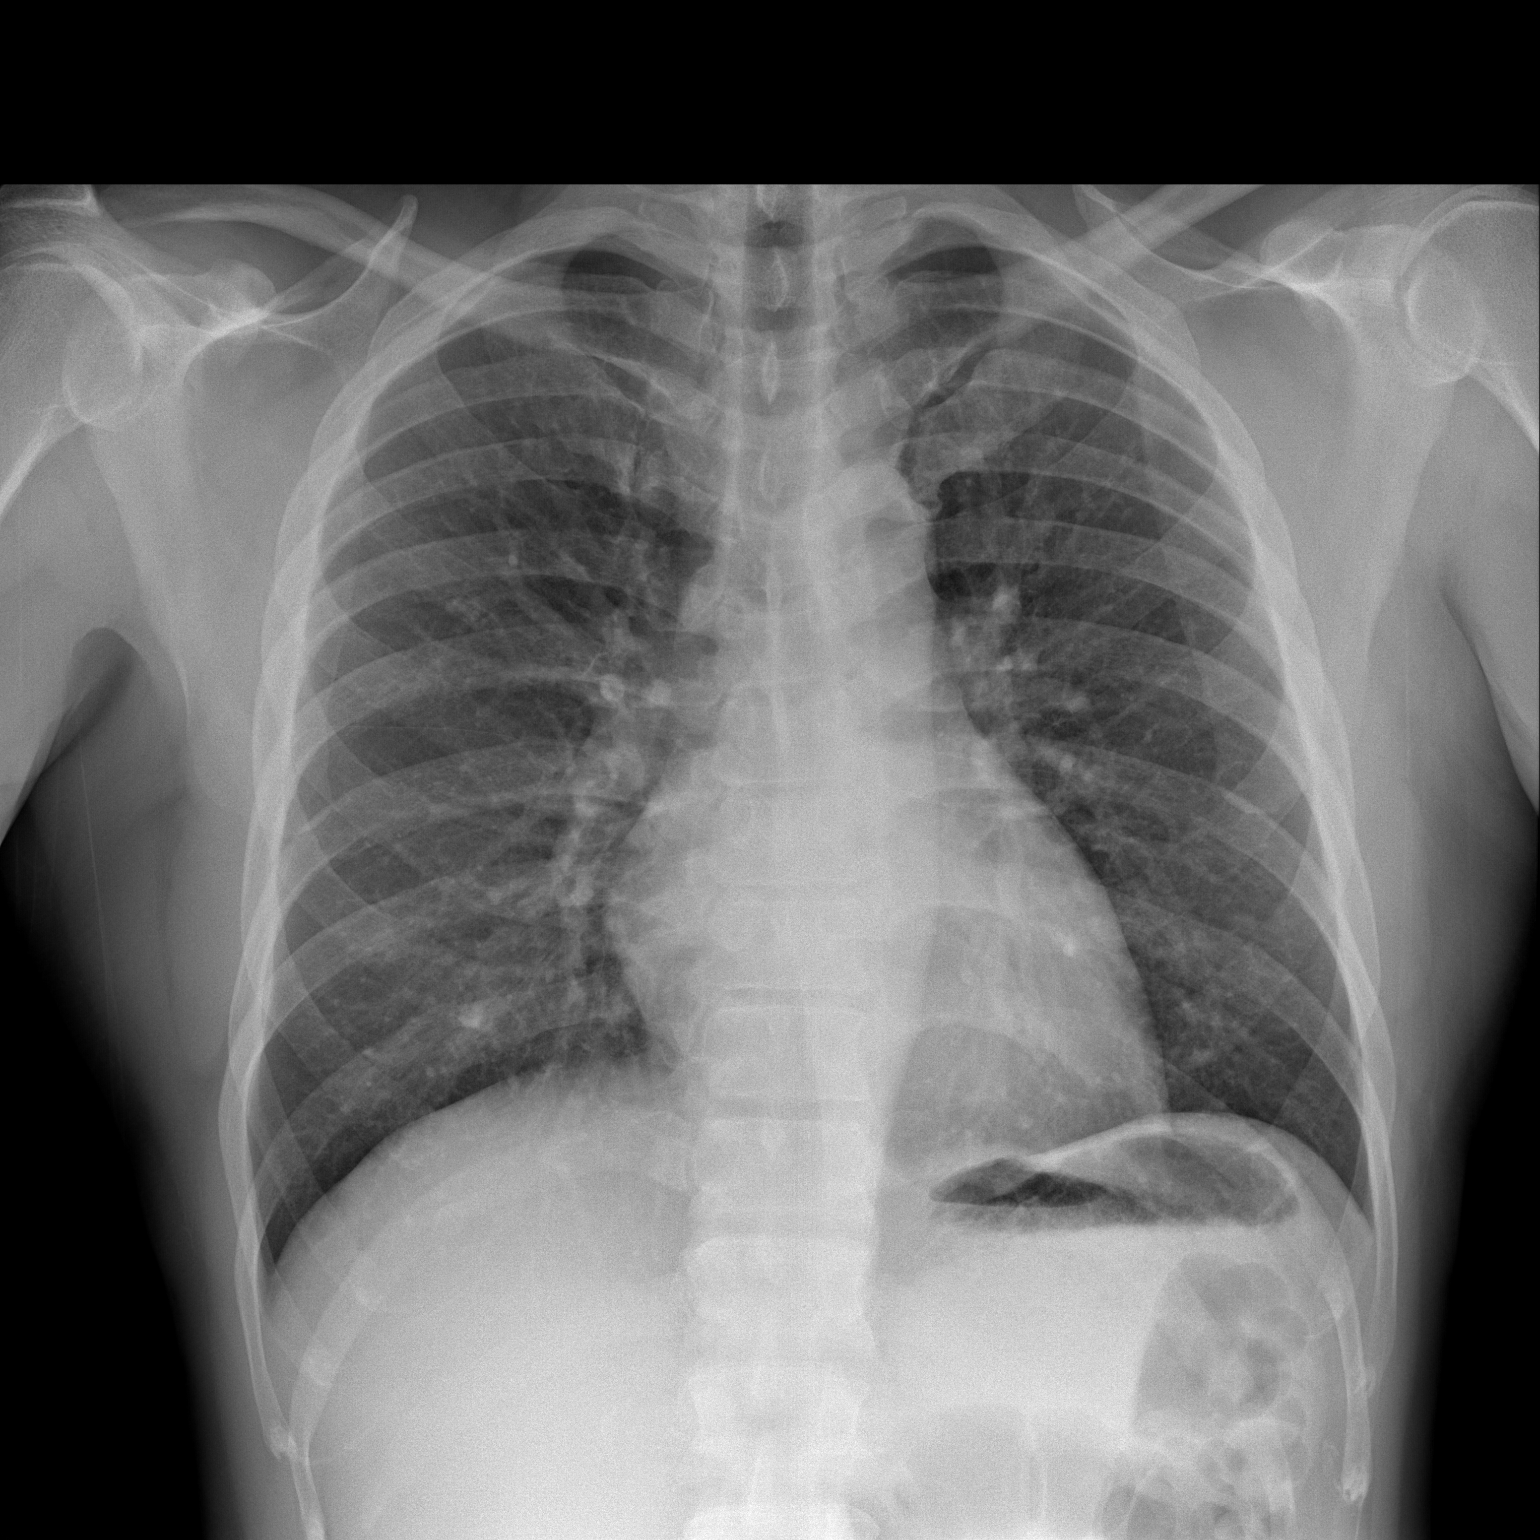
[im 2/2]
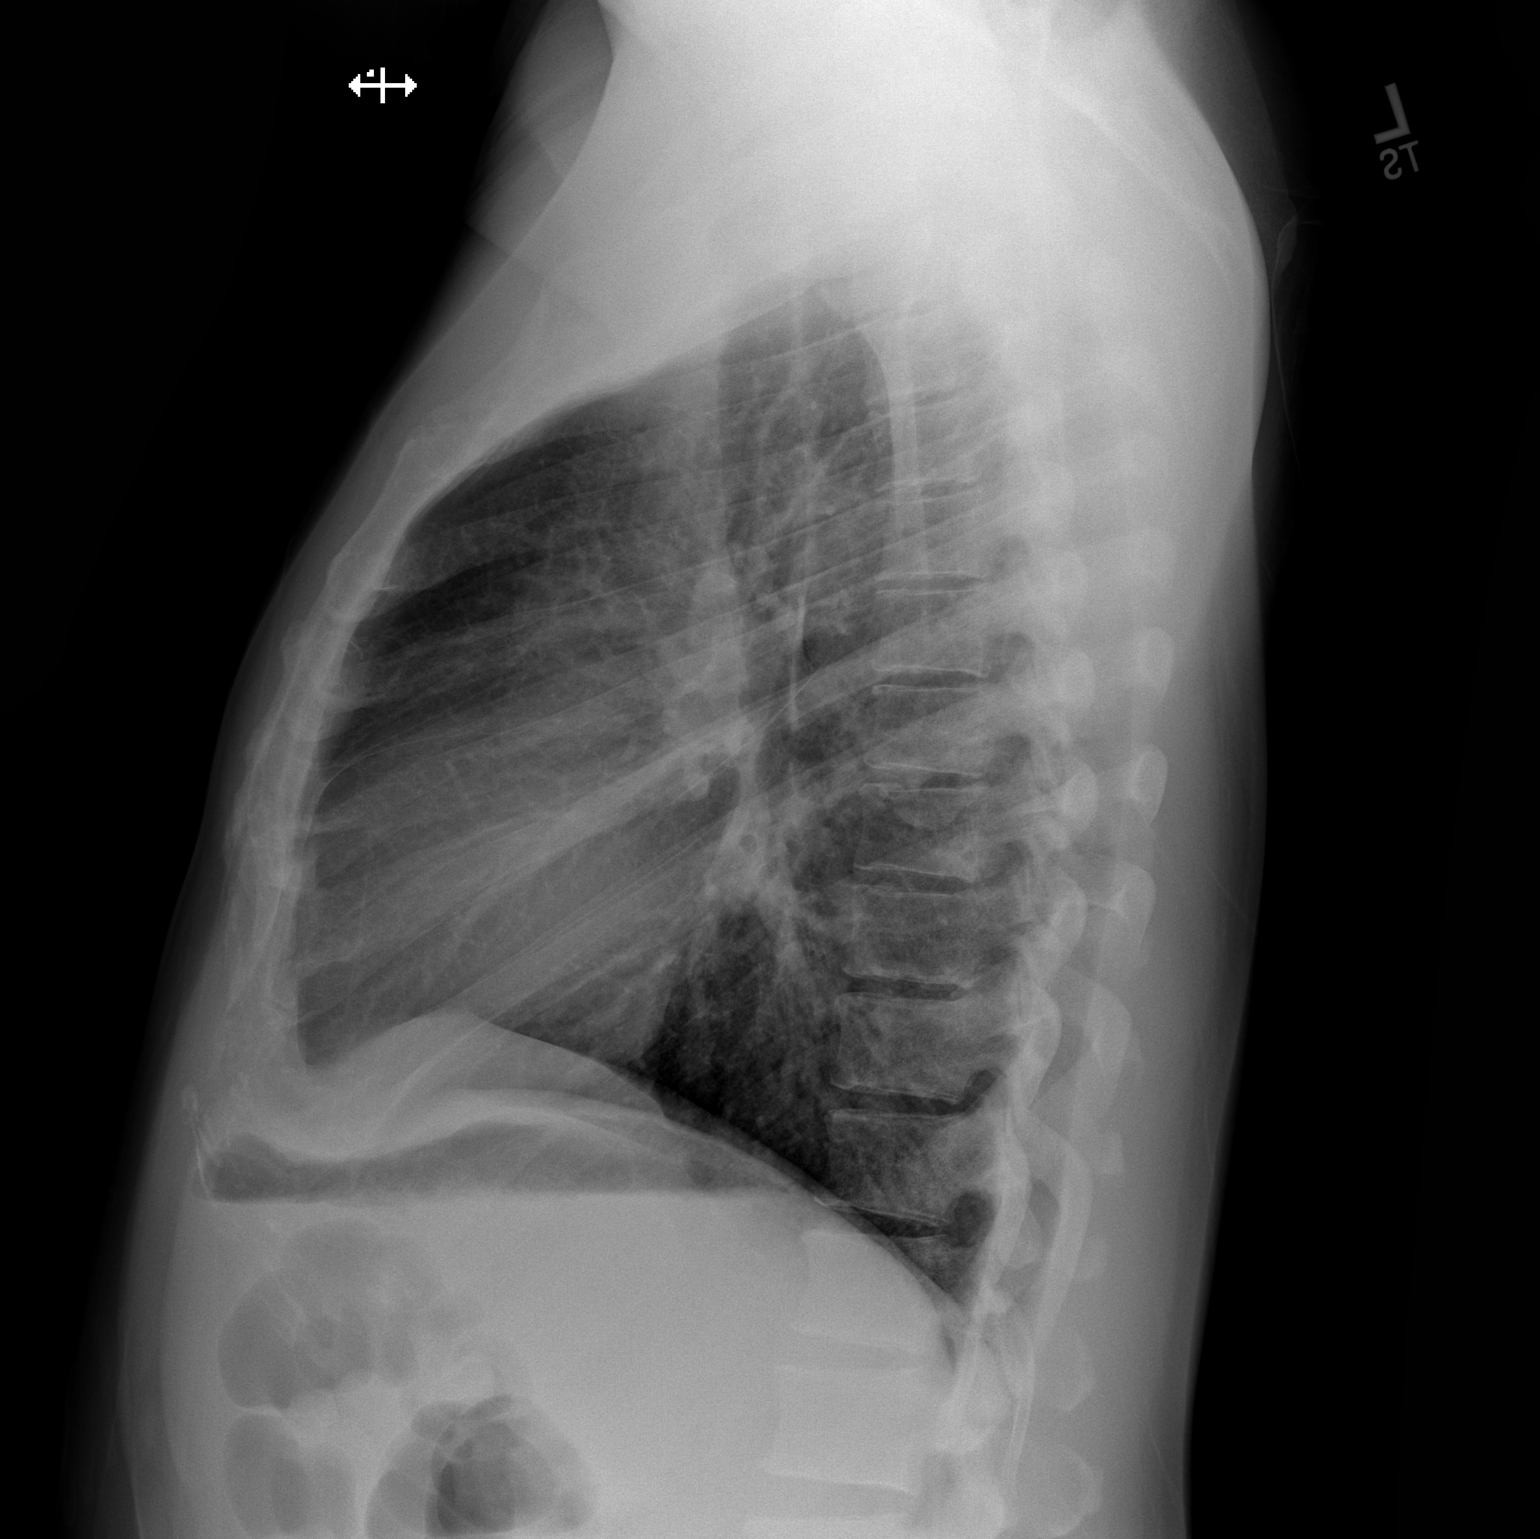

[2 of 2 positions shown; findings below may reference images not displayed]

FINDINGS: The heart and mediastinum are within normal limits. Mild interstitial
prominence is felt to be technical. No focal pulmonary opacities.
IMPRESSION: No acute cardiopulmonary disease.

## 2014-07-28 ENCOUNTER — Other Ambulatory Visit: Payer: Self-pay

## 2014-07-28 ENCOUNTER — Ambulatory Visit (INDEPENDENT_AMBULATORY_CARE_PROVIDER_SITE_OTHER): Payer: Self-pay | Admitting: Physician Assistant

## 2014-07-28 ENCOUNTER — Other Ambulatory Visit: Payer: Self-pay | Admitting: Cardiovascular Disease

## 2014-07-28 ENCOUNTER — Encounter: Payer: Self-pay | Admitting: Cardiovascular Disease

## 2014-07-28 ENCOUNTER — Telehealth: Payer: Self-pay | Admitting: *Deleted

## 2014-07-28 VITALS — BP 142/100 | HR 67 | Ht 71.0 in | Wt 215.5 lb

## 2014-07-28 DIAGNOSIS — E785 Hyperlipidemia, unspecified: Secondary | ICD-10-CM

## 2014-07-28 DIAGNOSIS — I159 Secondary hypertension, unspecified: Secondary | ICD-10-CM

## 2014-07-28 DIAGNOSIS — I255 Ischemic cardiomyopathy: Secondary | ICD-10-CM

## 2014-07-28 DIAGNOSIS — I2510 Atherosclerotic heart disease of native coronary artery without angina pectoris: Secondary | ICD-10-CM

## 2014-07-28 MED ORDER — AMLODIPINE BESYLATE 5 MG PO TABS: 5.0000 mg | ORAL_TABLET | Freq: Every day | ORAL | Status: DC

## 2014-07-28 NOTE — Telephone Encounter (Signed)
Called patient to instruct hin to decrease his zocor to 20 mg once daily per Dr. Kirke CorinArida  R/T starting Norvasc LVM

## 2014-07-28 NOTE — Patient Instructions (Addendum)
Your physician has recommended you make the following change in your medication:  Start Amlodipine 5 mg once daily   Your physician recommends that you have labs today:  [facility] registration desk  CBC  BMP  CBC  Lipid panel   Your physician wants you to follow-up in: 6 months.  You will receive a reminder letter in the mail two months in advance. If you don't receive a letter, please call our office to schedule the follow-up appointment.

## 2014-07-29 MED ORDER — SIMVASTATIN 20 MG PO TABS: 20.0000 mg | ORAL_TABLET | Freq: Every day | ORAL | Status: DC

## 2014-07-29 NOTE — Telephone Encounter (Signed)
Informed patient of Dr. Sheilah PigeonAridas orders Patient verbalized understanding

## 2014-07-30 NOTE — Progress Notes (Signed)
Patient Name: Travis Morton, Travis Morton [birth year], MRN [id number]  Date of Encounter: 07/30/2014  Primary Care Provider:  No primary care provider on file. Primary Cardiologist:  Dr. Kirke Corin, MD  Chief Complaint  Patient presents with  . other    6 month f/u no complaints. Meds reviewed verbally with pt.    HPI:  49 y.o. male with history of CAD and ICM presents for routine follow up. He was admitted on 05/30/2012 to [facility] and subsequently transferred to [facility] for management of non-ST segment elevation myocardial infarction. He underwent diagnostic catheterization at [facility] revealing severe LAD and diagonal disease, as well as a chronic total occlusion of the RCA. The LAD was treated with a drug-eluting stent while the diagonal was treated with balloon angioplasty. Followup echo while hospitalized showed an EF of 30%. He had a follow up echo in May of 2014 which showed EF 55-60%. He has quit smoking and drinking since his MI. It was initially thought that he had an allergy to aspirin and was therefore not on it. At his last outpatient follow up he was started on antiplatelet medication with Plavix 75 mg daily. Shortly thereafter the patient called the office stating he took a medication that he thought was aspirin and had an allergic reaction and later found out it was not aspirin. He self decided to stop Plavix and start aspirin 2 weeks prior to contacting us. He then notified us of this decision. It was deemed ok by his primary cardiologist.   He continues to do well and denies any chest pain, dyspnea, palpitations or syncope. He is taking all of his medications as directed without issues. His blood pressure at home when he does check it usually runs in the 130s-140s systolic. He reports that he is not living a very active lifestyle. He spends most of his time on the sofa watching TV. He is eating a healthier diet.     Past Medical History  Diagnosis Date  . Coronary artery disease       a. 05/2012  NSTEMI/Cath/PCI: critical complex LAD stenosis, chronic total occlusion of RCA w/ L-R collaterals, moderate LCx stenosis, and severe LV dysfunction -->DES to LAD (3.0x3mm Promus Premier), PTCA first diagonal;   . Ischemic cardiomyopathy     a. 05/2012 Echo: EF 30%, grade 2 diastolic dysfunction, severe anterior and anteroseptal hypokinesis, akinesis of the apical septal and apical inferior walls.  . Tobacco abuse   . ETOH abuse   . Hyperlipidemia   . Hypertension   :  Past Surgical History  Procedure Laterality Date  . Coronary angioplasty with stent placement      05/2012 s/p PTCA/DES to LAD, PTCA first diagonal  . Left heart catheterization with coronary angiogram Bilateral 05/30/2012    Procedure: LEFT HEART CATHETERIZATION WITH CORONARY ANGIOGRAM;  Surgeon: Tonny Bollman, MD;  Location: [facility] CATH LAB;  Service: Cardiovascular;  Laterality: Bilateral;  :  Social History:  The patient  reports that he quit smoking about 2 years ago. His smoking use included Cigarettes. He has a 12 pack-year smoking history. He does not have any smokeless tobacco history on file. He reports that he uses illicit drugs (Cocaine). He reports that he does not drink alcohol.   Family History  Problem Relation Age of Onset  . Hypertension Mother   . Hypertension Father      Allergies:  Allergies  Allergen Reactions  . Aspirin Hives     Home Medications:  Current Outpatient Prescriptions  Medication Sig Dispense Refill  . aspirin 81 MG tablet Take 81 mg by mouth daily.    . carvedilol (COREG) 6.25 MG tablet Take 0.5 tablets (3.125 mg total) by mouth 2 (two) times daily. 180 tablet 0  . lisinopril (PRINIVIL,ZESTRIL) 10 MG tablet Take 1 tablet (10 mg total) by mouth daily. 90 tablet 0  . nitroGLYCERIN (NITROSTAT) 0.4 MG SL tablet Place 1 tablet (0.4 mg total) under the tongue every 5 (five) minutes as needed for chest pain (up to 3 doses). 25 tablet 3  . amLODipine (NORVASC) 5 MG tablet Take 1 tablet (5  mg total) by mouth daily. 30 tablet 6  . simvastatin (ZOCOR) 20 MG tablet Take 1 tablet (20 mg total) by mouth at bedtime. 30 tablet 6   No current facility-administered medications for this visit.     Weights: Wt Readings from Last 3 Encounters:  07/28/14 215 lb 8 oz (97.75 kg)  07/08/13 198 lb 8 oz (90.039 kg)  12/07/12 180 lb 8 oz (81.874 kg)     Review of Systems:  As above.  All other systems reviewed and are otherwise negative except as noted above.  Physical Exam:  Blood pressure 142/100, pulse 67, height 5\' 11"  (1.803 m), weight 215 lb 8 oz (97.75 kg).  General: Pleasant, NAD Psych: Normal affect. Neuro: Alert and oriented X 3. Moves all extremities spontaneously. HEENT: Normal  Neck: Supple without bruits or JVD. Lungs:  Resp regular and unlabored, CTA. Heart: RRR no s3, s4, or murmurs. Abdomen: Soft, non-tender, non-distended, BS + x 4.  Extremities: No clubbing, cyanosis or edema. DP/PT/Radials 2+ and equal bilaterally.   Accessory Clinical Findings:  EKG - NSR, 67 bpm, left axis deviation, no st/t changes   Recent Labs: See [facility].    Lipid Panel    Component Value Date/Time   CHOL 144 07/20/2012 0907   CHOL 202* 05/31/2012 0523   TRIG 128 07/20/2012 0907   HDL 48 07/20/2012 0907   HDL 49 05/31/2012 0523   CHOLHDL 3.0 07/20/2012 0907   CHOLHDL 4.1 05/31/2012 0523   VLDL 32 05/31/2012 0523   LDLCALC 70 07/20/2012 0907   LDLCALC 121* 05/31/2012 0523    Assessment & Plan:  1. CAD s/p stenting/balloon angioplasty as above: -He continues to do well with no recurrent angina -He continues to take aspirin 81 mg daily without issue, Plavix was discontinued 06/2013 (he was on this in place of maintenance aspirin as it was thought he was allergic to aspirin, though that does not appear to be the case) -Continue aspirin 81 mg daily  -Continue Coreg 6.25 mg bid -Continue simvastatin 20 mg qhs  -Work on lifestyle changes   2.  HTN: -Add amlodipine 5 mg daily -Continue Coreg 6.25 mg bid and lisinopril 10 mg daily -Check BMET and TSH  3. HLD: -See scanned FLP [facility] -Simvastatin was decreased by primary cardiologist to 20 mg daily -Continue with healthy lifestyle chioces -He plans to start a walking program  Dispo: -Follow up 6 months with Dr. Kirke CorinArida, MD   Eula Listenyan Jabe Jeanbaptiste, PA-C [facility] [address] [place], [place] [address] [phone] [facility] 07/30/2014, 8:37 AM

## 2014-08-12 ENCOUNTER — Other Ambulatory Visit: Payer: Self-pay | Admitting: Cardiovascular Disease

## 2014-08-13 ENCOUNTER — Other Ambulatory Visit: Payer: Self-pay

## 2014-08-13 MED ORDER — SIMVASTATIN 20 MG PO TABS: 20.0000 mg | ORAL_TABLET | Freq: Every day | ORAL | Status: DC

## 2014-08-13 MED ORDER — LISINOPRIL 10 MG PO TABS: 10.0000 mg | ORAL_TABLET | Freq: Every day | ORAL | Status: DC

## 2014-09-19 NOTE — Consult Note (Signed)
PATIENT NAME:  Travis Morton, Travis Morton MR#:  [id number] DATE OF BIRTH:  01-04-1966  DATE OF CONSULTATION:  05/30/2012  REFERRING PHYSICIAN:  Marlaine Hind, MD CONSULTING PHYSICIAN:  Muhammad A. Kirke Corin, MD  REASON FOR CONSULTATION: Myocardial infarction.   HISTORY OF PRESENT ILLNESS: This is a 49 year old African American male with unremarkable past medical history, except for tobacco use. He presented to the Emergency Room with chest pain which started last evening, at 7:00 p.m. He was sitting on his porch smoking a cigarette when he started having substernal tightness and pressure feeling associated with shortness of breath. The episode lasted for about 1-1/2 hours. It was relieved with nitroglycerin and morphine in the Emergency Room.  He reports no chest discomfort since then. Initial troponin was 0.08, second one was 1.1 and the third was greater than 40. His initial EKG showed hyperacute T waves in the anterior leads with minor reciprocal ST depression in the inferior leads. Repeat EKG showed improved T wave in the anterior leads with no diagnostic ST elevation. I repeated his ECG this morning and it is clearly showing deeply inverted T waves in the anterior leads with 1 mm of ST elevation in V1 and V2 with loss of R wave progression. These changes are suggestive of an evolving anterior myocardial infarction.   PAST MEDICAL HISTORY: 1. Tobacco use.  2. Possible excessive alcohol use.   HOME MEDICATIONS: None.   ALLERGIES: ASPIRIN CAUSED RASH.   SOCIAL HISTORY: He smokes 1/2 a pack per day and has been doing so since the age of 75. He consumes 2 cases of beer a week. He usually drinks every other day.   FAMILY HISTORY: Remarkable for hypertension, but no premature coronary artery disease.   REVIEW OF SYSTEMS: A 10 point review of systems was performed. It is negative other than what is mentioned in the history of present illness.    PHYSICAL EXAMINATION:  GENERAL: The patient appears to be at  his stated age, in no acute distress.   VITAL SIGNS: Temperature is 96.8, pulse is 52, respiratory rate is 15, blood pressure is 118/68 and oxygen saturation is 100% on 2 liters nasal cannula.   HEENT: Head is normocephalic, atraumatic.   NECK: No JVD or carotid bruits.   RESPIRATORY: Normal respiratory effort with no use of accessory muscles. Auscultation reveals normal breath sounds.   CARDIOVASCULAR: Normal PMI. Normal S1 and S2 with no gallops or murmurs.   ABDOMEN: Benign, nontender and nondistended.   EXTREMITIES: No clubbing, cyanosis or edema.   SKIN: Warm and dry with no rash.   PSYCHIATRIC: He is alert and oriented x 3 with normal mood and affect.   LABORATORY AND DIAGNOSTIC DATA: Creatinine was 1.12. Troponin was greater than 40. Urine toxicology screen was positive for opiate, but he did receive morphine in the ED. It was negative for cocaine. CBC was remarkable for a white cell count of 14.7. D-dimer was less than 0.22.   ECG is described above.   ASSESSMENT AND PLAN: 1. Evolving anterior myocardial infarction.  2. Tobacco use.  3. Possible excessive alcohol use.   RECOMMENDATIONS: The patient's EKG changes are highly concerning for an evolving anterior myocardial infarction. He is currently chest pain free. However, given his EKG changes and cardiac enzymes, I recommend proceeding with an urgent cardiac catheterization. Risks, benefits and alternatives were discussed with the patient. I discussed the case with Dr. Excell Seltzer at [facility] who accepted the patient for transfer for urgent catheterization. In the  meanwhile, continue unfractionated heparin and small dose nitroglycerin drip. He is allergic to aspirin and already received 600 mg of Plavix. Best approach might be to treat him with Effient after PCI. I agree with a statin. Smoking cessation was strongly advised.  ____________________________ Chelsea AusMuhammad A. Kirke CorinArida, MD maa:sb D: 05/30/2012 10:08:34  ET T: 05/30/2012 10:24:24 ET JOB#: [id number]  cc: Jerolyn CenterMuhammad A. Kirke CorinArida, MD, <Dictator> Iran OuchMUHAMMAD A ARIDA MD ELECTRONICALLY SIGNED 06/19/2012 17:37

## 2014-09-19 NOTE — Consult Note (Signed)
Brief Consult Note: Diagnosis: evolving anterior MI.   Consult note dictated.   Recommend to proceed with surgery or procedure.   Discussed with Attending MD.   Comments: Continue Heparin, NTG drip.  Recommend urgent cardiac cath today. [facility].  Electronic Signatures: Lorine BearsArida, Muhammad (MD)  (Signed 01-Jan-14 09:59)  Authored: Brief Consult Note   Last Updated: 01-Jan-14 09:59 by Lorine BearsArida, Muhammad (MD)

## 2014-09-19 NOTE — Discharge Summary (Signed)
Morton NAME:  Travis Morton, Travis Morton MR#:  [id number] DATE OF BIRTH:  1965-06-13  DATE OF ADMISSION:  05/30/2012 DATE OF DISCHARGE:  05/30/2012  PRIMARY CARE PHYSICIAN: None.   CHIEF COMPLAINT: Chest pain.   DISCHARGE DIAGNOSES: 1. Acute non-Q-wave myocardial infarction. 2. Chronic smoker.  3. Elevated white count.  4. Mild hypokalemia.   VITALS: Temperature is 96.8, blood pressure is 118/68, pulse is 52 and saturations are 100% on 2 liters.   CURRENT LABS/RADIOLOGIC STUDIES: Troponin is more than 40. CK total is 1928. Urine drug screen is positive for opiates. Second set of troponin was 1.10. First set of troponin was 0.08.   Chest x-ray: No acute cardiopulmonary abnormality.   Hemoglobin and hematocrit is 15 and 45.3. White count is 14.7. Glucose is 161, BUN 12, creatinine 1.12, sodium 137, potassium 3.4 and chloride is 103. Travis rest of Travis electrolytes are normal. D-dimer is less than 0.22.   CONSULTANT:  Lorine BearsMuhammad Arida, MD - Cardiology. He recommends transfer to [facility] for further evaluation.   DISCHARGE MEDICATIONS: 1. IV heparin drip per nomogram.  2. IV nitroglycerin drip; Travis Morton is currently on 5 mcg/min. 3. Nitroglycerin 0.4 mg sublingual p.r.n. for chest pain.  4. Tylenol 650 mg q. 4 hours p.r.n.  5. Atorvastatin 20 mg at bedtime. 6. Plavix 75 mg daily.  7. Metoprolol 25 mg q. 6 hours.  8. Morphine 2 to 4 mg IV push q. 4 hours p.r.n.  9. Zofran 4 mg IV push q. 4 hours p.r.n.  10. Zantac 150 mg q. 12 hours.   BRIEF SUMMARY OF HOSPITAL COURSE: Travis Morton is a 1942 gentleman with no past medical history other than chronic smoking who comes to [facility] after having onset of chest pain on and off for 3 weeks. His chest pain got aggravated yesterday without any precipitating factors and came to [facility] and was admitted with:  1. Acute non-Q-wave MI. Travis Morton has history of risks factor with chronic smoking and family history. He is currently  chest pain free. He was admitted in [facility] intensive care unit. His first set of troponin was 0.08 and went up to 1.10 and last set of troponin was 40. Travis Morton was chest pain free, although repeat EKG showed evidence of evolving acute non-Q-wave MI. Travis Morton received a loading dose of Plavix 600 mg and currently on beta blockers, statins and heparin along with nitro drip. Travis Morton was seen by Dr. Kirke CorinArida this morning. Given his presentation and clinical findings along with labs and since [facility] cath lab is closed [facility], Travis Morton is being transferred [facility] for further evaluation on his acute non-Q-wave MI.  Echo will [facility] at [facility]. Arrangements are made by Dr. Kirke CorinArida.  2. Mild hypokalemia. Replaced p.o.  3. Tobacco abuse. Advised cessation. Travis Morton agrees.  4. Alcohol abuse. Travis Morton drinks about 10 beers every other day. No signs of withdrawal currently. Watch for it. PRN Ativan as needed.   His hospital stay otherwise remained stable. Travis Morton remained a FULL CODE.   TIME SPENT: 40 minutes.  ____________________________ Wylie HailSona A. Allena KatzPatel, MD sap:sb D: 05/30/2012 09:58:58 ET T: 05/30/2012 10:15:28 ET JOB#: [id number]  cc: Jaelynn Currier A. Allena KatzPatel, MD, <Dictator> Willow OraSONA A Ladasha Schnackenberg MD ELECTRONICALLY SIGNED 05/30/2012 10:34

## 2014-09-19 NOTE — H&P (Signed)
PATIENT NAME:  Travis Morton, Travis Morton MR#:  [id number] DATE OF BIRTH:  1965-06-12  DATE OF ADMISSION:  05/30/2012  PRIMARY CARE PHYSICIAN: He has no local physician.   REFERRING PHYSICIAN: Dr. Lowella Fairy   CHIEF COMPLAINT: Chest pain.   HISTORY OF PRESENT ILLNESS: The patient is a 49 year old African American male with unremarkable past medical history, no hypertension, no diabetes. The patient was sitting around 7:00 p.m. on his porch, and after about 10 minutes from smoking his cigarette he started to have midsternal chest pain described as a pressure-like feeling, 7 to 8 on a scale of 10, associated with some shortness of breath. The pain persisted, and after an hour or so his wife drove him to the Emergency Department for evaluation. Here his troponin came back to be elevated, reaching 0.08. Then the second troponin was higher at 1.1. The patient is now in the process to be admitted to the intensive care unit for further evaluation and treatment.    REVIEW OF SYSTEMS:  CONSTITUTIONAL: Denies having any fever, no chills, no fatigue.  EYES: No blurring of vision. No double vision.  ENT: No hearing impairment. No sore throat. No dysphagia.  CARDIOVASCULAR: Reports chest pain and shortness of breath as above. No edema. No syncope.  RESPIRATORY: Mild shortness of breath. No cough. No sputum production. No hemoptysis.  GASTROINTESTINAL: No abdominal pain. No vomiting. No diarrhea.  GENITOURINARY: No dysuria. No frequency of urination.  MUSCULOSKELETAL: No joint pain or swelling. No muscular pain or swelling.  INTEGUMENTARY: No skin rash. No ulcers.  NEUROLOGY: No focal weakness. No seizure activity. No headache.  PSYCHIATRY: No anxiety, no depression.  ENDOCRINE: No polyuria or polydipsia. No heat or cold intolerance.   PAST MEDICAL HISTORY: Healthy, no history of hypertension, no diabetes, no prior coronary artery disease.  SOCIAL HABITS: Chronic smoker, 1/2 pack a day since age of 58. He  drinks beer every other day, about 12 packs, since the age of 17. No history of drug abuse.   SOCIAL HISTORY: He is now unemployed, but 2 weeks ago he was working at [employer]. The patient is single but he lives with his girlfriend.   FAMILY HISTORY: Both parents had hypertension, but there is no family history of premature coronary artery disease.   ADMISSION MEDICATIONS: None.   ALLERGIES: ASPIRIN, CAUSING SKIN RASH.   PHYSICAL EXAMINATION: VITAL SIGNS: Blood pressure 109/72, respiratory rate 17, pulse 64, temperature 98.8, and oxygen saturation 100%.  GENERAL APPEARANCE: Young male lying in bed in no acute distress, thin and healthy looking.  HEENT: Head: No pallor. No icterus. No cyanosis. Ears: Normal hearing. No discharge. No lesions. Nasal mucosa: Normal. No discharge. No ulcerations. Oropharyngeal area was normal without oral thrush, no exudates, no ulcers. Eyes: Normal iris and conjunctivae. Pupils are constricted.  I could not see reactivity to light. The patient already received morphine.  NECK: Supple. Trachea at midline. No thyromegaly. No cervical lymphadenopathy. No masses.  HEART: Normal S1, S2. No S3, S4. No murmur, no gallop. No carotid bruits.  LUNGS: Normal breathing pattern without use of accessory muscles. No rales. No wheezing.  ABDOMEN: Soft without tenderness. No hepatosplenomegaly. No masses. No hernias.  SKIN: No ulcers. No subcutaneous nodules.  MUSCULOSKELETAL: No joint swelling. No clubbing.  NEUROLOGIC: Cranial nerves II through XII are intact. No focal motor deficit. Sense of touch is preserved.  PSYCHIATRIC: The patient is alert and oriented x 3. Mood and affect were normal.   LABORATORY, DIAGNOSTIC AND RADIOLOGICAL  DATA:  His EKG showed normal sinus rhythm at rate of 68 per minute. Mild ST depression in the inferior leads, in particular lead II and aVF, otherwise unremarkable EKG.  Chest x-ray showed no acute cardiopulmonary abnormality.   Serum  glucose 161, BUN 12, creatinine 1.1, sodium 137, potassium 3.4, calcium 8.9. Liver function tests and liver transaminases were normal. Initial CPK 343, then 451.0. Troponin was 0.08, then 1.1. Drug screen was positive for opiate. CBC showed white count of 14,000, hemoglobin 15, hematocrit 45, platelet count 243.0. D-dimer was less than 0.2.   ASSESSMENT: 1. Chest pain secondary to non-ST elevation myocardial infarction.  2. Mild hypokalemia.  3. Tobacco abuse.  4. Alcohol abuse.  5. Mild elevation of blood sugar.   PLAN: I will admit the patient to the intensive care unit. IV heparin protocol started after a bolus. IV nitroglycerin started. Cardiology consultation, Dr. Kirke CorinArida was consulted; and he recommended to give a total of 600 mg of Plavix. The patient is ALLERGIC TO ASPIRIN, therefore, this was not utilized. I will add beta blocker using metoprolol. Pain control with morphine. The patient right now is chest pain free. I will also add a statin using atorvastatin. Potassium supplementation to correct the hypokalemia. May need to be evaluated at one point for underlying diabetes. I advised the patient to quit smoking. I offered nicotine patch, but he said it is not necessary and he was not willing to have it. I answered his questions and the questions of his girlfriend.   TIME SPENT: Evaluating this patient took more than 55 minutes.   ____________________________ Carney CornersAmir M. Rudene Rearwish, MD amd:cb D: 05/30/2012 01:00:55 ET T: 05/30/2012 07:26:48 ET JOB#: [id number]  cc: Carney CornersAmir M. Rudene Rearwish, MD, <Dictator> Zollie ScaleAMIR M Paz Winsett MD ELECTRONICALLY SIGNED 05/30/2012 23:03

## 2014-12-29 ENCOUNTER — Other Ambulatory Visit: Payer: Self-pay | Admitting: *Deleted

## 2014-12-29 MED ORDER — LISINOPRIL 10 MG PO TABS: 10.0000 mg | ORAL_TABLET | Freq: Every day | ORAL | Status: DC

## 2014-12-29 NOTE — Telephone Encounter (Signed)
Refill sent for Lisinopril.

## 2014-12-29 NOTE — Telephone Encounter (Signed)
°  1. Which medications need to be refilled? Lisinopril   2. Which [facility] [place]   3. Do they need a 30 day or 90 day supply? 90   4. Would they like a call back once the medication has been sent to the pharmacy? No

## 2015-02-24 ENCOUNTER — Telehealth: Payer: Self-pay | Admitting: *Deleted

## 2015-02-24 MED ORDER — SIMVASTATIN 20 MG PO TABS: 20.0000 mg | ORAL_TABLET | Freq: Every day | ORAL | Status: DC

## 2015-02-24 NOTE — Telephone Encounter (Signed)
Pharmacy requesting clarification for smivastatin 40 mg tab Per phone note 07/28/14:   Fransico Setters, RN at 07/29/2014 10:39 AM     Status: Signed       Expand All Collapse All   Informed patient of Dr. Sheilah Pigeon orders Patient verbalized understanding             Fransico Setters, RN at 07/28/2014 5:46 PM     Status: Signed       Expand All Collapse All   Called patient to instruct hin to decrease his zocor to 20 mg once daily per Dr. Kirke Corin  R/T starting Norvasc LVM         Medicine list updated to reflect simvastatin 20 mg daily

## 2015-05-01 ENCOUNTER — Telehealth: Payer: Self-pay | Admitting: *Deleted

## 2015-05-01 MED ORDER — CARVEDILOL 6.25 MG PO TABS: 3.1250 mg | ORAL_TABLET | Freq: Two times a day (BID) | ORAL | Status: DC

## 2015-05-01 NOTE — Telephone Encounter (Signed)
°*  STAT* If patient is at the pharmacy, call can be transferred to refill team.   1. Which medications need to be refilled? (please list name of each medication and dose if known) Carvedilol   2. Which pharmacy/location (including street and city if local pharmacy) is medication to be sent to? [facility] [place]    3. Do they need a 30 day or 90 day supply? 90 day

## 2015-05-01 NOTE — Telephone Encounter (Signed)
Refill sent for carvedilol.  

## 2015-05-26 ENCOUNTER — Ambulatory Visit: Payer: Self-pay | Admitting: Cardiovascular Disease

## 2015-07-06 ENCOUNTER — Ambulatory Visit: Payer: Self-pay | Admitting: Cardiovascular Disease

## 2015-07-14 ENCOUNTER — Encounter: Payer: Self-pay | Admitting: Cardiovascular Disease

## 2015-07-14 ENCOUNTER — Ambulatory Visit (INDEPENDENT_AMBULATORY_CARE_PROVIDER_SITE_OTHER): Payer: Self-pay | Admitting: Cardiovascular Disease

## 2015-07-14 VITALS — BP 138/80 | HR 76 | Ht 71.0 in | Wt 217.8 lb

## 2015-07-14 DIAGNOSIS — Z720 Tobacco use: Secondary | ICD-10-CM

## 2015-07-14 DIAGNOSIS — I159 Secondary hypertension, unspecified: Secondary | ICD-10-CM

## 2015-07-14 DIAGNOSIS — I2510 Atherosclerotic heart disease of native coronary artery without angina pectoris: Secondary | ICD-10-CM

## 2015-07-14 DIAGNOSIS — I255 Ischemic cardiomyopathy: Secondary | ICD-10-CM

## 2015-07-14 DIAGNOSIS — E785 Hyperlipidemia, unspecified: Secondary | ICD-10-CM

## 2015-07-14 NOTE — Assessment & Plan Note (Signed)
Continue treatment with simvastatin with a target LDL of less than 70. I requested fasting lipid and liver profile.

## 2015-07-14 NOTE — Assessment & Plan Note (Signed)
He smokes on occasions when he drinks alcohol. I advised him to quit smoking completely.

## 2015-07-14 NOTE — Progress Notes (Signed)
HPI  50 year old male who is here today for a followup visit regarding coronary artery disease and ischemic cardiomyopathy.  He was admitted on 05/30/2012 to [facility] and subsequently transferred to [facility] for management of non-ST segment elevation myocardial infarction. [facility] revealing severe LAD and diagonal disease as well as a chronic total occlusion of the right coronary artery. The LAD was treated with a drug-eluting stent while the diagonal was treated with balloon angioplasty. Followup echocardiogram while hospitalized showed an EF of 30%.  He had a repeat echocardiogram in May of 2014 which showed normal LV systolic function. He initially reported aspirin allergy but then it turned out that he was mistaken for another medication. He has been on low-dose aspirin now for more than a year with no reported side effects. She has been doing well and denies any chest pain or shortness of breath. He quit smoking after his myocardial infarction. However, he smokes few cigarettes when he drinks a beer.  Allergies  Allergen Reactions  . Aspirin Hives     Current Outpatient Prescriptions on File Prior to Visit  Medication Sig Dispense Refill  . amLODipine (NORVASC) 5 MG tablet Take 1 tablet (5 mg total) by mouth daily. 30 tablet 6  . aspirin 81 MG tablet Take 81 mg by mouth daily.    . carvedilol (COREG) 6.25 MG tablet Take 0.5 tablets (3.125 mg total) by mouth 2 (two) times daily. 180 tablet 0  . lisinopril (PRINIVIL,ZESTRIL) 10 MG tablet Take 1 tablet (10 mg total) by mouth daily. 90 tablet 3  . nitroGLYCERIN (NITROSTAT) 0.4 MG SL tablet Place 1 tablet (0.4 mg total) under the tongue every 5 (five) minutes as needed for chest pain (up to 3 doses). 25 tablet 3  . simvastatin (ZOCOR) 20 MG tablet Take 1 tablet (20 mg total) by mouth at bedtime. 30 tablet 6   No current facility-administered medications on file prior to visit.     Past  Medical History  Diagnosis Date  . Coronary artery disease       a. 05/2012 NSTEMI/Cath/PCI: critical complex LAD stenosis, chronic total occlusion of RCA w/ L-R collaterals, moderate LCx stenosis, and severe LV dysfunction -->DES to LAD (3.0x74mm Promus Premier), PTCA first diagonal;   . Ischemic cardiomyopathy     a. 05/2012 Echo: EF 30%, grade 2 diastolic dysfunction, severe anterior and anteroseptal hypokinesis, akinesis of the apical septal and apical inferior walls.  . Tobacco abuse   . ETOH abuse   . Hyperlipidemia   . Hypertension      Past Surgical History  Procedure Laterality Date  . Coronary angioplasty with stent placement      05/2012 s/p PTCA/DES to LAD, PTCA first diagonal  . Left heart catheterization with coronary angiogram Bilateral 05/30/2012    Procedure: LEFT HEART CATHETERIZATION WITH CORONARY ANGIOGRAM;  Surgeon: Tonny Bollman, MD;  Location: [facility] CATH LAB;  Service: Cardiovascular;  Laterality: Bilateral;     Family History  Problem Relation Age of Onset  . Hypertension Mother   . Hypertension Father      Social History   Social History  . Marital Status: Single    Spouse Name: N/A  . Number of Children: N/A  . Years of Education: N/A   Occupational History  . Not on file.   Social History Main Topics  . Smoking status: Former Smoker -- 0.50 packs/day for 24 years    Types: Cigarettes    Quit date:  06/01/2012  . Smokeless tobacco: Not on file     Comment: Smoking 1 pack per week; started back 6 months ago.   . Alcohol Use: 7.2 oz/week    12 Cans of beer per week     Comment: week   . Drug Use: Yes    Special: Cocaine     Comment: past  . Sexual Activity: Not on file   Other Topics Concern  . Not on file   Social History Narrative      PHYSICAL EXAM   BP 138/80 mmHg  Pulse 76  Ht  (1.803 m)  Wt 217 lb 12 oz (98.771 kg)  BMI 30.38 kg/m2  Constitutional: He is oriented to person, place, and time. He appears well-developed  and well-nourished. No distress.  HENT: No nasal discharge.  Head: Normocephalic and atraumatic.  Eyes: Pupils are equal and round. Right eye exhibits no discharge. Left eye exhibits no discharge.  Neck: Normal range of motion. Neck supple. No JVD present. No thyromegaly present.  Cardiovascular: Normal rate, regular rhythm, normal heart sounds and. Exam reveals no gallop and no friction rub. No murmur heard.  Pulmonary/Chest: Effort normal and breath sounds normal. No stridor. No respiratory distress. He has no wheezes. He has no rales. He exhibits no tenderness.  Abdominal: Soft. Bowel sounds are normal. He exhibits no distension. There is no tenderness. There is no rebound and no guarding.  Musculoskeletal: Normal range of motion. He exhibits no edema and no tenderness.  Neurological: He is alert and oriented to person, place, and time. Coordination normal.  Skin: Skin is warm and dry. No rash noted. He is not diaphoretic. No erythema. No pallor.  Psychiatric: He has a normal mood and affect. His behavior is normal. Judgment and thought content normal.      EKG: Normal sinus rhythm with a PVC.   ASSESSMENT AND PLAN

## 2015-07-14 NOTE — Assessment & Plan Note (Signed)
He is doing very well overall with no symptoms suggestive of angina. Continue medical therapy. 

## 2015-07-14 NOTE — Assessment & Plan Note (Signed)
His blood pressure is reasonably controlled on current medications. I requested basic metabolic profile.

## 2015-07-14 NOTE — Patient Instructions (Addendum)
Medication Instructions:  Your physician recommends that you continue on your current medications as directed. Please refer to the Current Medication list given to you today.   Labwork: Fasting lipid and liver profile. Nothing to eat or drink after midnight the evening before your labs. BMET You may have your labs drawn at [facility]. Use the [address] entrance and stop at Registration desk.  Testing/Procedures: none  Follow-Up: Your physician wants you to follow-up in: one year with Dr. Kirke Corin.  You will receive a reminder letter in the mail two months in advance. If you don't receive a letter, please call our office to schedule the follow-up appointment.   Any Other Special Instructions Will Be Listed Below (If Applicable).     If you need a refill on your cardiac medications before your next appointment, please call your pharmacy.

## 2015-07-22 ENCOUNTER — Other Ambulatory Visit
Admission: RE | Admit: 2015-07-22 | Discharge: 2015-07-22 | Disposition: A | Discharge status: Home / Self Care | Payer: Self-pay | Source: Ambulatory Visit | Attending: Cardiovascular Disease | Admitting: Cardiovascular Disease

## 2015-07-22 ENCOUNTER — Other Ambulatory Visit: Payer: Self-pay

## 2015-07-22 DIAGNOSIS — I2510 Atherosclerotic heart disease of native coronary artery without angina pectoris: Secondary | ICD-10-CM

## 2015-07-22 DIAGNOSIS — I159 Secondary hypertension, unspecified: Secondary | ICD-10-CM

## 2015-07-22 LAB — BASIC METABOLIC PANEL
ANION GAP: 6 (ref 5–15)
BUN: 15 mg/dL (ref 6–20)
CALCIUM: 9.1 mg/dL (ref 8.9–10.3)
CO2: 27 mmol/L (ref 22–32)
Chloride: 106 mmol/L (ref 101–111)
Creatinine, Ser: 0.92 mg/dL (ref 0.61–1.24)
GFR calc non Af Amer: >60 mL/min (ref >60)
GLUCOSE: 105 mg/dL — AB (ref 65–99)
POTASSIUM: 4.3 mmol/L (ref 3.5–5.1)
Sodium: 139 mmol/L (ref 135–145)

## 2015-07-22 LAB — LIPID PANEL
Cholesterol: 154 mg/dL (ref 0–200)
HDL: 40 mg/dL — AB (ref >40)
LDL Cholesterol: 88 mg/dL (ref 0–99)
Total CHOL/HDL Ratio: 3.9 RATIO
Triglycerides: 129 mg/dL (ref <150)
VLDL: 26 mg/dL (ref 0–40)

## 2015-07-22 LAB — HEPATIC FUNCTION PANEL
ALBUMIN: 4.1 g/dL (ref 3.5–5.0)
ALT: 20 U/L (ref 17–63)
AST: 16 U/L (ref 15–41)
Alkaline Phosphatase: 74 U/L (ref 38–126)
Bilirubin, Direct: <0.1 mg/dL — ABNORMAL LOW (ref 0.1–0.5)
TOTAL PROTEIN: 7.7 g/dL (ref 6.5–8.1)
Total Bilirubin: 0.6 mg/dL (ref 0.3–1.2)

## 2015-07-22 MED ORDER — SIMVASTATIN 40 MG PO TABS: 40.0000 mg | ORAL_TABLET | Freq: Every day | ORAL | Status: DC

## 2015-08-02 ENCOUNTER — Other Ambulatory Visit: Payer: Self-pay | Admitting: Physician Assistant

## 2015-08-04 ENCOUNTER — Other Ambulatory Visit: Payer: Self-pay | Admitting: *Deleted

## 2015-08-04 ENCOUNTER — Telehealth: Payer: Self-pay | Admitting: Cardiovascular Disease

## 2015-08-04 MED ORDER — CARVEDILOL 6.25 MG PO TABS: 3.1250 mg | ORAL_TABLET | Freq: Two times a day (BID) | ORAL | Status: DC

## 2015-08-04 MED ORDER — AMLODIPINE BESYLATE 5 MG PO TABS: 5.0000 mg | ORAL_TABLET | Freq: Every day | ORAL | Status: DC

## 2015-08-04 NOTE — Telephone Encounter (Signed)
Requested Prescriptions   Signed Prescriptions Disp Refills  . amLODipine (NORVASC) 5 MG tablet 90 tablet 3    Sig: Take 1 tablet (5 mg total) by mouth daily.    Authorizing Provider: Lorine BearsARIDA, MUHAMMAD A    Ordering User: Shawnie DapperLOPEZ, MARINA C  . carvedilol (COREG) 6.25 MG tablet 90 tablet 3    Sig: Take 0.5 tablets (3.125 mg total) by mouth 2 (two) times daily.    Authorizing Provider: Lorine BearsARIDA, MUHAMMAD A    Ordering User: Kendrick FriesLOPEZ, MARINA C

## 2015-08-04 NOTE — Telephone Encounter (Signed)
*  STAT* If patient is at the pharmacy, call can be transferred to refill team.   1. Which medications need to be refilled? (please list name of each medication and dose if known) amLODipine (NORVASC) 5 MG tablet, carvedilol (COREG) 6.25 MG tablet    2. Which pharmacy/location (including street and city if local pharmacy) is medication to be sent to? [facility]  [place]  3. Do they need a 30 day or 90 day supply? 90 day

## 2015-08-25 ENCOUNTER — Other Ambulatory Visit: Payer: Self-pay

## 2015-08-25 MED ORDER — SIMVASTATIN 40 MG PO TABS: 40.0000 mg | ORAL_TABLET | Freq: Every day | ORAL | Status: DC

## 2015-08-25 NOTE — Telephone Encounter (Signed)
90 day supply

## 2016-02-19 ENCOUNTER — Other Ambulatory Visit: Payer: Self-pay | Admitting: Cardiovascular Disease

## 2016-02-22 ENCOUNTER — Other Ambulatory Visit: Payer: Self-pay

## 2016-02-22 MED ORDER — LISINOPRIL 10 MG PO TABS: 10.0000 mg | ORAL_TABLET | Freq: Every day | ORAL | 3 refills | Status: DC

## 2016-08-01 ENCOUNTER — Telehealth: Payer: Self-pay | Admitting: Cardiovascular Disease

## 2016-08-01 ENCOUNTER — Other Ambulatory Visit: Payer: Self-pay

## 2016-08-01 MED ORDER — SIMVASTATIN 40 MG PO TABS: 40.0000 mg | ORAL_TABLET | Freq: Every day | ORAL | 3 refills | Status: DC

## 2016-08-01 NOTE — Telephone Encounter (Signed)
Refill sent in to [facility] [address] [place], [place]

## 2016-08-01 NOTE — Telephone Encounter (Signed)
Requested Prescriptions   Signed Prescriptions Disp Refills  . simvastatin (ZOCOR) 40 MG tablet 90 tablet 3    Sig: Take 1 tablet (40 mg total) by mouth at bedtime.    Authorizing Provider: GOLLAN, TIMOTHY J    Ordering User: Shaughnessy Gethers N    

## 2016-08-01 NOTE — Telephone Encounter (Signed)
°*  STAT* If patient is at the pharmacy, call can be transferred to refill team.   1. Which medications need to be refilled? (please list name of each medication and dose if known)  Simvastatin 40 mg po qhs  2. Which pharmacy/location (including street and city if local pharmacy) is medication to be sent to? [facility] [address] [place]   3. Do they need a 30 day or 90 day supply? 30

## 2016-09-03 ENCOUNTER — Other Ambulatory Visit: Payer: Self-pay | Admitting: Cardiovascular Disease

## 2016-09-05 ENCOUNTER — Other Ambulatory Visit: Payer: Self-pay | Admitting: Cardiovascular Disease

## 2016-09-06 ENCOUNTER — Telehealth: Payer: Self-pay | Admitting: Cardiovascular Disease

## 2016-09-06 NOTE — Telephone Encounter (Signed)
-----   Message from Festus Aloe, CMA sent at 09/05/2016  1:01 PM EDT ----- Can you contact this patient for a 1 year follow up with Arida? The patient was to see Arida back in Feb. 2018.  Thanks again for your help.  Bishop Dublin

## 2016-09-06 NOTE — Telephone Encounter (Signed)
LMOV to schedule appt   °

## 2016-09-09 NOTE — Telephone Encounter (Signed)
lmov to schedule appt  °

## 2016-09-21 NOTE — Telephone Encounter (Signed)
lmov to schedule.  Mailed letter.

## 2016-10-17 ENCOUNTER — Ambulatory Visit: Payer: Self-pay | Admitting: Cardiovascular Disease

## 2016-10-31 ENCOUNTER — Other Ambulatory Visit: Payer: Self-pay | Admitting: Cardiovascular Disease

## 2016-12-08 ENCOUNTER — Ambulatory Visit: Payer: Self-pay | Admitting: Cardiovascular Disease

## 2017-01-19 ENCOUNTER — Other Ambulatory Visit: Payer: Self-pay | Admitting: Cardiovascular Disease

## 2017-01-19 NOTE — Telephone Encounter (Signed)
Please advise if ok to refill , Pt last due appointment was in February 18,  Pt is past due but is scheduled for future appointment.

## 2017-01-24 ENCOUNTER — Other Ambulatory Visit: Payer: Self-pay | Admitting: Cardiovascular Disease

## 2017-02-06 ENCOUNTER — Ambulatory Visit: Payer: Self-pay | Admitting: Cardiovascular Disease

## 2017-03-28 ENCOUNTER — Ambulatory Visit: Payer: Self-pay | Admitting: Cardiovascular Disease

## 2017-04-16 ENCOUNTER — Other Ambulatory Visit: Payer: Self-pay | Admitting: Cardiovascular Disease

## 2017-04-19 ENCOUNTER — Other Ambulatory Visit: Payer: Self-pay | Admitting: Cardiovascular Disease

## 2017-05-24 ENCOUNTER — Other Ambulatory Visit: Payer: Self-pay | Admitting: Cardiovascular Disease

## 2017-05-25 ENCOUNTER — Ambulatory Visit: Payer: Self-pay | Admitting: Cardiovascular Disease

## 2017-06-26 ENCOUNTER — Encounter: Payer: Self-pay | Admitting: Cardiovascular Disease

## 2017-07-14 ENCOUNTER — Ambulatory Visit: Payer: Self-pay | Admitting: Cardiovascular Disease

## 2017-07-17 ENCOUNTER — Other Ambulatory Visit: Payer: Self-pay | Admitting: Cardiovascular Disease

## 2017-09-05 ENCOUNTER — Ambulatory Visit: Payer: Self-pay | Admitting: Cardiovascular Disease

## 2017-09-17 ENCOUNTER — Other Ambulatory Visit: Payer: Self-pay | Admitting: Cardiovascular Disease

## 2017-09-23 ENCOUNTER — Other Ambulatory Visit: Payer: Self-pay | Admitting: Cardiovascular Disease

## 2017-10-23 ENCOUNTER — Other Ambulatory Visit: Payer: Self-pay | Admitting: Cardiovascular Disease

## 2017-10-25 ENCOUNTER — Other Ambulatory Visit: Payer: Self-pay | Admitting: Cardiovascular Disease

## 2017-10-31 ENCOUNTER — Other Ambulatory Visit: Payer: Self-pay | Admitting: Cardiovascular Disease

## 2017-11-03 ENCOUNTER — Ambulatory Visit: Payer: Self-pay | Admitting: Cardiovascular Disease

## 2017-11-25 ENCOUNTER — Other Ambulatory Visit: Payer: Self-pay | Admitting: Cardiovascular Disease

## 2017-12-03 ENCOUNTER — Other Ambulatory Visit: Payer: Self-pay | Admitting: Cardiovascular Disease

## 2017-12-07 ENCOUNTER — Other Ambulatory Visit: Payer: Self-pay | Admitting: Cardiovascular Disease

## 2017-12-13 NOTE — Progress Notes (Signed)
Cardiology Office Note Date:  12/14/2017  Patient ID:  Travis Morton, DOB 1965/10/19, MRN [id number] PCP:  Patient, No Pcp Per  Cardiologist:  Dr. Kirke CorinArida, MD    Chief Complaint: Follow up  History of Present Illness: Travis Morton is a 52 y.o. male with history of CAD with a NSTEMI in 2014 s/p PCI/DES to the LAD, ICM with EF of 30% with subsequent normalization of EF by echo in 09/2012, HTN, HLD, and tobacco/alcohol abuse who presents for follow up of his CAD and ICM.  Patient [facility] to [facility] in 2014 with chest pain and was found to have a NSTEMI. He was transferred to [facility] and underwent LHC that showed severe LAD and diagonal disease as well as CTO of the RCA. He underwent successful PCI/DES as well as PTCA of the diagonal. Echo during that admission showed an EF of 30% with follow up echo in 09/2012 demonstrating normal LV systolic function with an EF of 55-60%, normal wall motion, normal LV diastolic function, mild TR, PASP normal. Initially, he reported an allergy to aspirin, though it was found out his allergy was actually to another medication and he was able to tolerate aspirin without issues. Following his MI, he did decrease his tobacco use, though was noted to still smoke and drink beer at this last follow up in 07/2015.   Labs: 07/2015: LDL 88, LFT normal, SCr 0.92  Patient comes in doing well from a cardiac perspective.  He has not had any chest pain, shortness of breath, diaphoresis, palpitations, presyncope, or syncope.  He does note dizziness that has been present since his MI and 2014.  He indicates this dizziness only occurs in the evening hours after he has taken his amlodipine, p.m. dose of carvedilol, and lisinopril.  This dizziness lasts for approximately 1 hour and spontaneously resolves.  He does not note this dizziness during the daytime after he has taking his morning dose of carvedilol.  He has not checked his blood pressure during these dizzy episodes.  He  has occasionally checked his morning blood pressure with readings typically in the 130s over 80s.  He drinks approximately 2 Fridays per month and will drink a 12 pack of beer during this time.  He is now only smoking when he is drinking alcohol.  He has noted some exertional dyspnea though has attributed this to decreased exercise as he is living a fairly sedentary lifestyle currently.  Past Medical History:  Diagnosis Date  . Coronary artery disease      a. 05/2012 NSTEMI/Cath/PCI: critical complex LAD stenosis, chronic total occlusion of RCA w/ L-R collaterals, moderate LCx stenosis, and severe LV dysfunction -->DES to LAD (3.0x5232mm Promus Premier), PTCA first diagonal;   . ETOH abuse   . Hyperlipidemia   . Hypertension   . Ischemic cardiomyopathy    a. 05/2012 Echo: EF 30%, grade 2 diastolic dysfunction, severe anterior and anteroseptal hypokinesis, akinesis of the apical septal and apical inferior walls.  . Tobacco abuse     Past Surgical History:  Procedure Laterality Date  . CORONARY ANGIOPLASTY WITH STENT PLACEMENT     05/2012 s/p PTCA/DES to LAD, PTCA first diagonal  . LEFT HEART CATHETERIZATION WITH CORONARY ANGIOGRAM Bilateral 05/30/2012   Procedure: LEFT HEART CATHETERIZATION WITH CORONARY ANGIOGRAM;  Surgeon: Tonny BollmanMichael Cooper, MD;  Location: [facility] CATH LAB;  Service: Cardiovascular;  Laterality: Bilateral;    Current Meds  Medication Sig  . amLODipine (NORVASC) 5 MG tablet TAKE 1 TABLET BY MOUTH  ONCE DAILY PATIENT  WILL  NEED  TO  KEEP  APPOINTMENT  FOR  FURTHER  REFILLS  . aspirin 81 MG tablet Take 81 mg by mouth daily.  . carvedilol (COREG) 6.25 MG tablet TAKE 1/2 (ONE-HALF) TABLET BY MOUTH TWICE DAILY  . carvedilol (COREG) 6.25 MG tablet TAKE 1/2 (ONE-HALF) TABLET BY MOUTH TWICE DAILY (NEEDS  TO  KEEP  APPOINTMENT  FOR  FURTHER  REFILLS)  . lisinopril (PRINIVIL,ZESTRIL) 10 MG tablet TAKE ONE TABLET BY MOUTH ONCE DAILY  . nitroGLYCERIN (NITROSTAT) 0.4 MG SL tablet Place 1 tablet  (0.4 mg total) under the tongue every 5 (five) minutes as needed for chest pain (up to 3 doses).  . simvastatin (ZOCOR) 40 MG tablet TAKE 1 TABLET (40 MG) BY MOUTH AT BEDTIME    Allergies:   Patient has no known allergies.   Social History:  The patient  reports that he has been smoking cigarettes.  He has a 12.00 pack-year smoking history. He has never used smokeless tobacco. He reports that he drinks about 7.2 oz of alcohol per week. He reports that he has current or past drug history. Drug: Cocaine.   Family History:  The patient's family history includes Hypertension in his father and mother.  ROS:   Review of Systems  Constitutional: Positive for malaise/fatigue. Negative for chills, diaphoresis, fever and weight loss.  HENT: Negative for congestion.   Eyes: Negative for discharge and redness.  Respiratory: Positive for shortness of breath. Negative for cough, hemoptysis, sputum production and wheezing.   Cardiovascular: Negative for chest pain, palpitations, orthopnea, claudication, leg swelling and PND.  Gastrointestinal: Negative for abdominal pain, blood in stool, heartburn, melena, nausea and vomiting.  Genitourinary: Negative for hematuria.  Musculoskeletal: Negative for falls and myalgias.  Skin: Negative for rash.  Neurological: Positive for dizziness and weakness. Negative for tingling, tremors, sensory change, speech change, focal weakness and loss of consciousness.  Endo/Heme/Allergies: Does not bruise/bleed easily.  Psychiatric/Behavioral: Negative for substance abuse. The patient is not nervous/anxious.   All other systems reviewed and are negative.    PHYSICAL EXAM:  VS:  BP 138/86 (BP Location: Left Arm, Patient Position: Sitting, Cuff Size: Normal)   Pulse 70   Ht 6' (1.829 m)   Wt 218 lb 12 oz (99.2 kg)   BMI 29.67 kg/m  BMI: Body mass index is 29.67 kg/m.  Physical Exam  Constitutional: He is oriented to person, place, and time. He appears well-developed  and well-nourished.  HENT:  Head: Normocephalic and atraumatic.  Eyes: Right eye exhibits no discharge. Left eye exhibits no discharge.  Neck: Normal range of motion. No JVD present.  Cardiovascular: Normal rate, regular rhythm, S1 normal, S2 normal and normal heart sounds. Exam reveals no distant heart sounds, no friction rub, no midsystolic click and no opening snap.  No murmur heard. Pulses:      Posterior tibial pulses are 2+ on the right side, and 2+ on the left side.  Pulmonary/Chest: Effort normal and breath sounds normal. No respiratory distress. He has no decreased breath sounds. He has no wheezes. He has no rales. He exhibits no tenderness.  Abdominal: Soft. He exhibits no distension. There is no tenderness.  Musculoskeletal: He exhibits no edema.  Neurological: He is alert and oriented to person, place, and time.  Skin: Skin is warm and dry. No cyanosis. Nails show no clubbing.  Psychiatric: He has a normal mood and affect. His speech is normal and behavior is normal. Judgment and thought  content normal.     EKG:  Was ordered and interpreted by me today. Shows NSR, 70 bpm, no acute ST-T changes  Recent Labs: No results found for requested labs within last 8760 hours.  No results found for requested labs within last 8760 hours.   CrCl cannot be calculated (Patient's most recent lab result is older than the maximum 21 days allowed.).   Wt Readings from Last 3 Encounters:  12/14/17 218 lb 12 oz (99.2 kg)  07/14/15 217 lb 12 oz (98.8 kg)  07/28/14 215 lb 8 oz (97.8 kg)     Other studies reviewed: Additional studies/records reviewed today include: summarized above  ASSESSMENT AND PLAN:  1. CAD of the native coronary arteries without angina: No symptoms concerning for angina at this time.  Continue current medical therapy with aspirin, carvedilol, lisinopril, and simvastatin.  Aggressive risk factor modification and secondary prevention.  No plans for ischemic evaluation at  this time.  Recommend the patient increase his physical activity level as below.  2. Dizziness: This appears to be in the setting of him taking amlodipine, carvedilol, and lisinopril in the evening.  We have decreased his lisinopril to 5 mg each evening and moved his amlodipine from the evening to morning.  Current regimen will include carvedilol 6.25 mg twice daily, amlodipine 5 mg every morning, lisinopril 5 mg every afternoon.  Should he note continued dizziness with this change he will let us know.  No association with palpitations, presyncope, or syncope.  Recommend patient check blood pressure during dizzy episodes.  3. Ischemic cardiomyopathy: With subsequent normalization of EF in 09/2012.  He does not appear grossly volume overloaded at this time.  Continue carvedilol and lisinopril.  No indication for standing diuretic therapy at this time.  Check echocardiogram as below.  4. Dyspnea: Schedule echocardiogram.  Cannot rule out possibility of physical deconditioning.  Recommend exercise regimen.  5. Essential hypertension: Medication regimen as above.  6. Hyperlipidemia: Check lipid and liver function today.  Goal LDL less than 70.  For now, continue simvastatin 40 mg nightly.  If LDL is not at goal would escalate to high intensity statin.  7. Tobacco/alcohol abuse: Complete cessation advised.  Disposition: F/u with Dr. Kirke Corin or APP in 1 month.  Current medicines are reviewed at length with the patient today.  The patient did not have any concerns regarding medicines.  Signed, Eula Listen, PA-C 12/14/2017 9:35 AM     [facility] [place] [address] [place], [place] [address] [phone]

## 2017-12-14 ENCOUNTER — Encounter: Payer: Self-pay | Admitting: Physician Assistant

## 2017-12-14 ENCOUNTER — Ambulatory Visit (INDEPENDENT_AMBULATORY_CARE_PROVIDER_SITE_OTHER): Payer: Self-pay | Admitting: Physician Assistant

## 2017-12-14 VITALS — BP 138/86 | HR 70 | Ht 72.0 in | Wt 218.8 lb

## 2017-12-14 DIAGNOSIS — I2510 Atherosclerotic heart disease of native coronary artery without angina pectoris: Secondary | ICD-10-CM

## 2017-12-14 DIAGNOSIS — I10 Essential (primary) hypertension: Secondary | ICD-10-CM

## 2017-12-14 DIAGNOSIS — I255 Ischemic cardiomyopathy: Secondary | ICD-10-CM

## 2017-12-14 DIAGNOSIS — R0609 Other forms of dyspnea: Secondary | ICD-10-CM

## 2017-12-14 DIAGNOSIS — E785 Hyperlipidemia, unspecified: Secondary | ICD-10-CM

## 2017-12-14 DIAGNOSIS — R42 Dizziness and giddiness: Secondary | ICD-10-CM

## 2017-12-14 MED ORDER — AMLODIPINE BESYLATE 5 MG PO TABS: 5.0000 mg | ORAL_TABLET | ORAL | 3 refills | Status: DC

## 2017-12-14 MED ORDER — LISINOPRIL 5 MG PO TABS: ORAL_TABLET | ORAL | 3 refills | Status: DC

## 2017-12-14 NOTE — Patient Instructions (Signed)
Medication Instructions:  Your physician has recommended you make the following change in your medication:  1- DECREASE Lisinopril to 5 mg by mouth once day in the evening. 2- CHANGE Amlodipine take it in the morning once a day.   Labwork: Your physician recommends that you return for lab work in: TODAY (CBC, BMET, LIPID).   Testing/Procedures: Your physician has requested that you have an echocardiogram. Echocardiography is a painless test that uses sound waves to create images of your heart. It provides your doctor with information about the size and shape of your heart and how well your heart's chambers and valves are working. This procedure takes approximately one hour. There are no restrictions for this procedure. You may get an IV, if needed, to receive an ultrasound enhancing agent through to better visualize your heart.    Follow-Up: Your physician recommends that you schedule a follow-up appointment in: 1 MONTH WITH DR ARIDA OR APP.  If you need a refill on your cardiac medications before your next appointment, please call your pharmacy.  Echocardiogram An echocardiogram, or echocardiography, uses sound waves (ultrasound) to produce an image of your heart. The echocardiogram is simple, painless, obtained within a short period of time, and offers valuable information to your health care provider. The images from an echocardiogram can provide information such as:  Evidence of coronary artery disease (CAD).  Heart size.  Heart muscle function.  Heart valve function.  Aneurysm detection.  Evidence of a past heart attack.  Fluid buildup around the heart.  Heart muscle thickening.  Assess heart valve function.  Tell a health care provider about:  Any allergies you have.  All medicines you are taking, including vitamins, herbs, eye drops, creams, and over-the-counter medicines.  Any problems you or family members have had with anesthetic medicines.  Any blood  disorders you have.  Any surgeries you have had.  Any medical conditions you have.  Whether you are pregnant or may be pregnant. What happens before the procedure? No special preparation is needed. Eat and drink normally. What happens during the procedure?  In order to produce an image of your heart, gel will be applied to your chest and a wand-like tool (transducer) will be moved over your chest. The gel will help transmit the sound waves from the transducer. The sound waves will harmlessly bounce off your heart to allow the heart images to be captured in real-time motion. These images will then be recorded.  You may need an IV to receive a medicine that improves the quality of the pictures. What happens after the procedure? You may return to your normal schedule including diet, activities, and medicines, unless your health care provider tells you otherwise. This information is not intended to replace advice given to you by your health care provider. Make sure you discuss any questions you have with your health care provider. Document Released: 05/13/2000 Document Revised: 01/02/2016 Document Reviewed: 01/21/2013 Elsevier Interactive Patient Education  2017 [employer]

## 2017-12-15 LAB — BASIC METABOLIC PANEL
BUN/Creatinine Ratio: 14 (ref 9–20)
BUN: 13 mg/dL (ref 6–24)
CHLORIDE: 103 mmol/L (ref 96–106)
CO2: 22 mmol/L (ref 20–29)
CREATININE: 0.92 mg/dL (ref 0.76–1.27)
Calcium: 9.3 mg/dL (ref 8.7–10.2)
GFR calc Af Amer: 112 mL/min/{1.73_m2} (ref >59)
GFR calc non Af Amer: 97 mL/min/{1.73_m2} (ref >59)
GLUCOSE: 123 mg/dL — AB (ref 65–99)
Potassium: 3.9 mmol/L (ref 3.5–5.2)
SODIUM: 141 mmol/L (ref 134–144)

## 2017-12-15 LAB — CBC WITH DIFFERENTIAL/PLATELET
BASOS ABS: 0 10*3/uL (ref 0.0–0.2)
Basos: 0 %
EOS (ABSOLUTE): 0.3 10*3/uL (ref 0.0–0.4)
EOS: 3 %
HEMATOCRIT: 38.6 % (ref 37.5–51.0)
Hemoglobin: 13 g/dL (ref 13.0–17.7)
IMMATURE GRANULOCYTES: 0 %
Immature Grans (Abs): 0 10*3/uL (ref 0.0–0.1)
Lymphocytes Absolute: 2.5 10*3/uL (ref 0.7–3.1)
Lymphs: 24 %
MCH: 28.8 pg (ref 26.6–33.0)
MCHC: 33.7 g/dL (ref 31.5–35.7)
MCV: 86 fL (ref 79–97)
MONOS ABS: 0.9 10*3/uL (ref 0.1–0.9)
Monocytes: 8 %
NEUTROS PCT: 65 %
Neutrophils Absolute: 6.6 10*3/uL (ref 1.4–7.0)
PLATELETS: 304 10*3/uL (ref 150–450)
RBC: 4.51 x10E6/uL (ref 4.14–5.80)
RDW: 14.2 % (ref 12.3–15.4)
WBC: 10.3 10*3/uL (ref 3.4–10.8)

## 2017-12-15 LAB — LIPID PANEL
CHOL/HDL RATIO: 3.8 ratio (ref 0.0–5.0)
CHOLESTEROL TOTAL: 147 mg/dL (ref 100–199)
HDL: 39 mg/dL — AB (ref >39)
LDL Calculated: 70 mg/dL (ref 0–99)
TRIGLYCERIDES: 189 mg/dL — AB (ref 0–149)
VLDL Cholesterol Cal: 38 mg/dL (ref 5–40)

## 2017-12-18 ENCOUNTER — Telehealth: Payer: Self-pay | Admitting: Physician Assistant

## 2017-12-18 NOTE — Telephone Encounter (Signed)
Pt returning our call for lab results ° °Please call back ° °

## 2017-12-18 NOTE — Telephone Encounter (Signed)
I attempted to call the patient with his lab results. No answer & his voice mail box is full.  Will try back later.

## 2017-12-20 NOTE — Telephone Encounter (Signed)
Patient returning call for lab results. 

## 2017-12-20 NOTE — Telephone Encounter (Signed)
Left a message to call back for lab results.  Notes recorded by Sondra Bargesunn, Ryan M, PA-C on 12/15/2017 at 12:19 PM EDT Random glucose ok Renal function normal Potassium ok Blood count normal LDL at goal  No changes

## 2017-12-21 ENCOUNTER — Encounter: Payer: Self-pay | Admitting: *Deleted

## 2017-12-21 NOTE — Telephone Encounter (Signed)
Letter with results mailed to patient.

## 2017-12-22 NOTE — Telephone Encounter (Signed)
Patient made aware of results and verbalized understanding.  

## 2017-12-22 NOTE — Telephone Encounter (Signed)
Patient returning call for results.  Please call.   °

## 2017-12-28 ENCOUNTER — Other Ambulatory Visit: Payer: Self-pay | Admitting: Cardiovascular Disease

## 2018-01-10 ENCOUNTER — Other Ambulatory Visit: Payer: Self-pay | Admitting: Cardiovascular Disease

## 2018-01-10 NOTE — Telephone Encounter (Signed)
Please review for refill pt has an appointment 01/31/18 last appointment pt not taking per intake.

## 2018-01-11 NOTE — Telephone Encounter (Signed)
I looked back at Travis Morton's note from 12/14/17. He was taking lisinopril 10 mg once daily when he came in that day and [name] dropped his dose down to 5 mg once daily. He sent in a new RX on 7/18 for the 5 mg tablets for a #90 day supply, so he should have what he needs.  Do not refill the lisinopril 10 mg tablets- Thanks!

## 2018-01-15 ENCOUNTER — Other Ambulatory Visit: Payer: Self-pay

## 2018-01-30 ENCOUNTER — Other Ambulatory Visit: Payer: Self-pay

## 2018-01-30 ENCOUNTER — Other Ambulatory Visit: Payer: Self-pay | Admitting: Cardiovascular Disease

## 2018-02-01 ENCOUNTER — Ambulatory Visit: Payer: Self-pay | Admitting: Physician Assistant

## 2018-03-20 ENCOUNTER — Telehealth: Payer: Self-pay | Admitting: Nurse Practitioner

## 2018-03-20 NOTE — Telephone Encounter (Signed)
-----   Message from Sondra Barges, PA-C sent at 03/20/2018 10:51 AM EDT ----- We can keep his appointment and call with his echo once completed. Thanks! ----- Message ----- From: Charlynn Grimes Sent: 03/20/2018   9:24 AM EDT To: Sondra Barges, PA-C  Morning :)  Just wanted to touch bases, so this patient Mr Ly was to have an echo prior to seeing you (this thursday) but he's cancelled and not rescheduled the echo. Just wanted to make sure does he need to have this done prior to seeing you or can he seen you and we call with echo results.

## 2018-03-20 NOTE — Telephone Encounter (Signed)
Lmov for patient to confirm appointment on Thursday

## 2018-03-21 NOTE — Progress Notes (Deleted)
Cardiology Office Note Date:  03/21/2018  Patient ID:  Travis Morton 1966/02/28, MRN [id number] PCP:  Patient, No Pcp Per  Cardiologist:  Dr. Kirke Corin, MD  ***refresh   Chief Complaint: Follow up  History of Present Illness: Travis Morton is a 52 y.o. male with history of CAD with a NSTEMI in 2014 s/p PCI/DES to the LAD, ICM with EF of 30% with subsequent normalization of EF by echo in 09/2012, HTN, HLD, and tobacco/alcohol abuse who presents for follow up of his CAD and ICM.  Patient presented to [facility] in 2014 with chest pain and was found to have a NSTEMI. He was transferred to [facility] and underwent LHC that showed severe LAD and diagonal disease as well as CTO of the RCA. He underwent successful PCI/DES as well as PTCA of the diagonal. Echo during that admission showed an EF of 30% with follow up echo in 09/2012 demonstrating normal LV systolic function with an EF of 55-60%, normal wall motion, normal LV diastolic function, mild TR, PASP normal. Initially, he reported an allergy to aspirin, though it was found out his allergy was actually to another medication and he was able to tolerate aspirin without issues. Following his MI, he did decrease his tobacco use, though was noted to still smoke and drink beer at this last follow up in 07/2015. He was seen in follow up on 12/14/17 and was doing well. He noted stable dizziness that had been present since his MI in 2014 and was present following his Coreg, amlodipine and lisinopril (all taken at nighttime). He was smoking socially when drinking. His lisinopril was decreased to 5 mg and changed from PM to AM dosing. He was also advised to take his amlodipine in the AM. He was continued on Coreg 6.25 mg bid. Echo was recommended for dyspnea, though has not been done yet. Labs checked at that time showed an LDL of 70, SCr 0.92, K+ 3.9, HGB 13.0.   ***  Past Medical History:  Diagnosis Date  . Coronary artery disease      a. 05/2012  NSTEMI/Cath/PCI: critical complex LAD stenosis, chronic total occlusion of RCA w/ L-R collaterals, moderate LCx stenosis, and severe LV dysfunction -->DES to LAD (3.0x57mm Promus Premier), PTCA first diagonal;   . ETOH abuse   . Hyperlipidemia   . Hypertension   . Ischemic cardiomyopathy    a. 05/2012 Echo: EF 30%, grade 2 diastolic dysfunction, severe anterior and anteroseptal hypokinesis, akinesis of the apical septal and apical inferior walls.  . Tobacco abuse     Past Surgical History:  Procedure Laterality Date  . CORONARY ANGIOPLASTY WITH STENT PLACEMENT     05/2012 s/p PTCA/DES to LAD, PTCA first diagonal  . LEFT HEART CATHETERIZATION WITH CORONARY ANGIOGRAM Bilateral 05/30/2012   Procedure: LEFT HEART CATHETERIZATION WITH CORONARY ANGIOGRAM;  Surgeon: Tonny Bollman, MD;  Location: [facility] CATH LAB;  Service: Cardiovascular;  Laterality: Bilateral;    No outpatient medications have been marked as taking for the 03/22/18 encounter (Appointment) with Sondra Barges, PA-C.    Allergies:   Patient has no known allergies.   Social History:  The patient  reports that he has been smoking cigarettes. He has a 12.00 pack-year smoking history. He has never used smokeless tobacco. He reports that he drinks about 12.0 standard drinks of alcohol per week. He reports that he has current or past drug history. Drug: Cocaine.   Family History:  The patient's family history includes Hypertension in  his father and mother.  ROS:   ROS   PHYSICAL EXAM: *** VS:  There were no vitals taken for this visit. BMI: There is no height or weight on file to calculate BMI.  Physical Exam   EKG:  Was ordered and interpreted by me today. Shows ***  Recent Labs: 12/14/2017: BUN 13; Creatinine, Ser 0.92; Hemoglobin 13.0; Platelets 304; Potassium 3.9; Sodium 141  12/14/2017: Chol/HDL Ratio 3.8; Cholesterol, Total 147; HDL 39; LDL Calculated 70; Triglycerides 189   CrCl cannot be calculated (Patient's most recent lab  result is older than the maximum 21 days allowed.).   Wt Readings from Last 3 Encounters:  12/14/17 218 lb 12 oz (99.2 kg)  07/14/15 217 lb 12 oz (98.8 kg)  07/28/14 215 lb 8 oz (97.8 kg)     Other studies reviewed: Additional studies/records reviewed today include: summarized above  ASSESSMENT AND PLAN:  1. CAD involving the native coronary arteries without*** angina: 2. HFrEF secondary to ICM: 3. Dizziness: 4. Dyspnea: 5. HTN: Blood pressure *** 6. HLD: Recent LDL from 11/2017 at goal of 70. Check LFT. Continue simvastatin.  7. Tobacco/alcohol abuse:   Disposition: F/u with Dr. Kirke Corin or an APP in ***  Current medicines are reviewed at length with the patient today.  The patient did not have any concerns regarding medicines.  Signed, Eula Listen, PA-C 03/21/2018 10:04 AM     [facility] [place] [address] [place], [place] [address] [phone]

## 2018-03-22 ENCOUNTER — Ambulatory Visit: Payer: Self-pay | Admitting: Physician Assistant

## 2018-03-29 ENCOUNTER — Encounter: Payer: Self-pay | Admitting: Physician Assistant

## 2018-04-13 NOTE — Progress Notes (Deleted)
Cardiology Office Note Date:  04/13/2018  Patient ID:  Travis Morton, DOB Feb 26, 1966, MRN [id number] PCP:  Patient, No Pcp Per  Cardiologist:  Dr. Kirke CorinArida, MD  ***refresh   Chief Complaint: Follow up  History of Present Illness: Travis Morton is a 52 y.o. male with history of CAD with a NSTEMI in 2014 s/p PCI/DES to the LAD, ICM with EF of 30% with subsequent normalization of EF by echo in 09/2012, HTN, HLD, and tobacco/alcohol abuse who presents for follow up of his CAD and ICM.  Patient presented to [facility] in 2014 with chest pain and was found to have a NSTEMI. He was transferred to [facility] and underwent LHC that showed severe LAD and diagonal disease as well as CTO of the RCA. He underwent successful PCI/DES to the LAD as well as PTCA of the diagonal. Echo during that admission showed an EF of 30% with follow up echo in 09/2012 demonstrating normal LV systolic function with an EF of 55-60%, normal wall motion, normal LV diastolic function, mild TR, PASP normal. Initially, he reported an allergy to aspirin, though it was found out his allergy was actually to another medication and he was able to tolerate aspirin without issues. Following his MI, he did decrease his tobacco use. He was last seen in the office in 11/2017 for follow up and was doing well. He did note some dizziness that had been present since his MI in 2014 that was associated with his PM medications. He continued to drink 2 Fridays a month and smoked while drinking. His lisinopril was decreased to 5 mg in the evening and his amlodipine 5 mg was moved to the morning. He was continued on Coreg 6.25 mg bid. Echo was recommended given dyspnea, though has not been completed.   Labs: 11/2017 - LDL 70, SCr 0.92, K+ 3.9, CBC unremarkable  ***   Past Medical History:  Diagnosis Date  . Coronary artery disease      a. 05/2012 NSTEMI/Cath/PCI: critical complex LAD stenosis, chronic total occlusion of RCA w/ L-R collaterals,  moderate LCx stenosis, and severe LV dysfunction -->DES to LAD (3.0x4932mm Promus Premier), PTCA first diagonal;   . ETOH abuse   . Hyperlipidemia   . Hypertension   . Ischemic cardiomyopathy    a. 05/2012 Echo: EF 30%, grade 2 diastolic dysfunction, severe anterior and anteroseptal hypokinesis, akinesis of the apical septal and apical inferior walls.  . Tobacco abuse     Past Surgical History:  Procedure Laterality Date  . CORONARY ANGIOPLASTY WITH STENT PLACEMENT     05/2012 s/p PTCA/DES to LAD, PTCA first diagonal  . LEFT HEART CATHETERIZATION WITH CORONARY ANGIOGRAM Bilateral 05/30/2012   Procedure: LEFT HEART CATHETERIZATION WITH CORONARY ANGIOGRAM;  Surgeon: Tonny BollmanMichael Cooper, MD;  Location: [facility] CATH LAB;  Service: Cardiovascular;  Laterality: Bilateral;    No outpatient medications have been marked as taking for the 04/18/18 encounter (Appointment) with Sondra Bargesunn, Armarion Greek M, PA-C.    Allergies:   Patient has no known allergies.   Social History:  The patient  reports that he has been smoking cigarettes. He has a 12.00 pack-year smoking history. He has never used smokeless tobacco. He reports that he drinks about 12.0 standard drinks of alcohol per week. He reports that he has current or past drug history. Drug: Cocaine.   Family History:  The patient's family history includes Hypertension in his father and mother.  ROS:   ROS   PHYSICAL EXAM: *** VS:  There  were no vitals taken for this visit. BMI: There is no height or weight on file to calculate BMI.  Physical Exam   EKG:  Was ordered and interpreted by me today. Shows ***  Recent Labs: 12/14/2017: BUN 13; Creatinine, Ser 0.92; Hemoglobin 13.0; Platelets 304; Potassium 3.9; Sodium 141  12/14/2017: Chol/HDL Ratio 3.8; Cholesterol, Total 147; HDL 39; LDL Calculated 70; Triglycerides 189   CrCl cannot be calculated (Patient's most recent lab result is older than the maximum 21 days allowed.).   Wt Readings from Last 3 Encounters:    12/14/17 218 lb 12 oz (99.2 kg)  07/14/15 217 lb 12 oz (98.8 kg)  07/28/14 215 lb 8 oz (97.8 kg)     Other studies reviewed: Additional studies/records reviewed today include: summarized above  ASSESSMENT AND PLAN:  1. ***  Disposition: F/u with Dr. Kirke Corin or an APP in ***  Current medicines are reviewed at length with the patient today.  The patient did not have any concerns regarding medicines.  Signed, Eula Listen, PA-C 04/13/2018 12:13 PM     [facility] [place] [address] [place], [place] [address] [phone]

## 2018-04-18 ENCOUNTER — Ambulatory Visit: Payer: Self-pay | Admitting: Physician Assistant

## 2018-04-19 ENCOUNTER — Encounter: Payer: Self-pay | Admitting: Physician Assistant

## 2018-04-23 ENCOUNTER — Other Ambulatory Visit: Payer: Self-pay | Admitting: Cardiovascular Disease

## 2019-01-09 ENCOUNTER — Other Ambulatory Visit: Payer: Self-pay | Admitting: Physician Assistant

## 2019-01-09 DIAGNOSIS — I10 Essential (primary) hypertension: Secondary | ICD-10-CM

## 2019-01-09 DIAGNOSIS — I255 Ischemic cardiomyopathy: Secondary | ICD-10-CM

## 2019-03-11 ENCOUNTER — Other Ambulatory Visit: Payer: Self-pay | Admitting: Cardiovascular Disease

## 2019-03-11 NOTE — Telephone Encounter (Signed)
No ans no vm   °

## 2019-03-11 NOTE — Telephone Encounter (Signed)
Please schedule overdue F/U appointment with Dr. Fletcher Anon.

## 2019-03-13 NOTE — Telephone Encounter (Signed)
No ans no vm   °

## 2019-03-14 ENCOUNTER — Other Ambulatory Visit: Payer: Self-pay

## 2019-03-14 MED ORDER — CARVEDILOL 6.25 MG PO TABS: ORAL_TABLET | ORAL | 0 refills | Status: DC

## 2019-03-14 NOTE — Telephone Encounter (Signed)
*  STAT* If patient is at the pharmacy, call can be transferred to refill team.   1. Which medications need to be refilled? (please list name of each medication and dose if known) Carvedilol 2. Which pharmacy/location (including street and city if local pharmacy) is medication to be sent to? [facility] [address]  3. Do they need a 30 day or 90 day supply? 90    

## 2019-03-19 NOTE — Telephone Encounter (Signed)
No ans no vm .  3 attempts to schedule fu appt  Closing encounter

## 2019-04-14 ENCOUNTER — Other Ambulatory Visit: Payer: Self-pay | Admitting: Cardiovascular Disease

## 2019-04-14 ENCOUNTER — Other Ambulatory Visit: Payer: Self-pay | Admitting: Physician Assistant

## 2019-04-14 DIAGNOSIS — I255 Ischemic cardiomyopathy: Secondary | ICD-10-CM

## 2019-04-14 DIAGNOSIS — I10 Essential (primary) hypertension: Secondary | ICD-10-CM

## 2019-04-30 ENCOUNTER — Other Ambulatory Visit: Payer: Self-pay | Admitting: Cardiovascular Disease

## 2019-05-19 ENCOUNTER — Other Ambulatory Visit: Payer: Self-pay | Admitting: Cardiovascular Disease

## 2019-05-19 DIAGNOSIS — I255 Ischemic cardiomyopathy: Secondary | ICD-10-CM

## 2019-05-19 DIAGNOSIS — I10 Essential (primary) hypertension: Secondary | ICD-10-CM

## 2019-05-20 ENCOUNTER — Other Ambulatory Visit: Payer: Self-pay | Admitting: Cardiovascular Disease

## 2019-05-20 DIAGNOSIS — I10 Essential (primary) hypertension: Secondary | ICD-10-CM

## 2019-05-20 DIAGNOSIS — I255 Ischemic cardiomyopathy: Secondary | ICD-10-CM

## 2019-05-21 NOTE — Telephone Encounter (Signed)
Call attempted to contact patient to confirm his carvedilol dosage. Unable to leave message.

## 2019-05-21 NOTE — Telephone Encounter (Signed)
Patient calling in regards to refills   *STAT* If patient is at the pharmacy, call can be transferred to refill team.   1. Which medications need to be refilled? (please list name of each medication and dose if known) amlodipine 5 MG Carvedilol 6.25 MG  2. Which pharmacy/location (including street and city if local pharmacy) is medication to be sent to? [facility]   3. Do they need a 30 day or 90 day supply?   Patient calling, patient scheduled an appointment with Dr Fletcher Anon 06/14/19, declined seeing an assistant sooner.  Patient will be out of medication tomorrow, please advise if a small amount of medication will be able to be sent in or if patient needs to be seen sooner.

## 2019-06-14 ENCOUNTER — Ambulatory Visit: Payer: Self-pay | Admitting: Cardiovascular Disease

## 2019-06-18 ENCOUNTER — Other Ambulatory Visit: Payer: Self-pay | Admitting: Cardiovascular Disease

## 2019-06-18 DIAGNOSIS — I255 Ischemic cardiomyopathy: Secondary | ICD-10-CM

## 2019-06-18 DIAGNOSIS — I10 Essential (primary) hypertension: Secondary | ICD-10-CM

## 2019-07-09 ENCOUNTER — Ambulatory Visit: Payer: Self-pay | Admitting: Cardiovascular Disease

## 2019-07-28 ENCOUNTER — Other Ambulatory Visit: Payer: Self-pay | Admitting: Cardiovascular Disease

## 2019-07-28 DIAGNOSIS — I255 Ischemic cardiomyopathy: Secondary | ICD-10-CM

## 2019-07-28 DIAGNOSIS — I10 Essential (primary) hypertension: Secondary | ICD-10-CM

## 2019-09-01 ENCOUNTER — Other Ambulatory Visit: Payer: Self-pay | Admitting: Cardiovascular Disease

## 2019-09-01 ENCOUNTER — Telehealth: Payer: Self-pay | Admitting: Cardiovascular Disease

## 2019-09-01 DIAGNOSIS — I10 Essential (primary) hypertension: Secondary | ICD-10-CM

## 2019-09-01 DIAGNOSIS — I255 Ischemic cardiomyopathy: Secondary | ICD-10-CM

## 2019-09-02 NOTE — Telephone Encounter (Signed)
Please call the patient to confirm how he is taking coreg 6.25 mg tablets.   Thanks!

## 2019-09-02 NOTE — Telephone Encounter (Signed)
Per Eula Listen last office note in 2019 -- "Current regimen will include carvedilol 6.25 mg twice daily, amlodipine 5 mg every morning, lisinopril 5 mg every afternoon."  Pharmacy is requesting Coreg 6.25 -- 1/2 tablet Twice a day

## 2019-09-02 NOTE — Telephone Encounter (Signed)
Please call to confirm directions

## 2019-09-04 ENCOUNTER — Ambulatory Visit: Payer: Self-pay | Admitting: Nurse Practitioner

## 2019-09-04 NOTE — Telephone Encounter (Signed)
Called patient.  No answer. LMOV.   

## 2019-09-05 ENCOUNTER — Other Ambulatory Visit: Payer: Self-pay | Admitting: *Deleted

## 2019-09-05 ENCOUNTER — Other Ambulatory Visit: Payer: Self-pay

## 2019-09-05 MED ORDER — CARVEDILOL 6.25 MG PO TABS: ORAL_TABLET | ORAL | 0 refills | Status: DC

## 2019-09-05 NOTE — Telephone Encounter (Signed)
carvedilol (COREG) 6.25 MG tablet 30 tablet 0 09/05/2019    Sig: TAKE 1/2 (ONE-HALF) TABLET BY MOUTH TWICE DAILY   Sent to pharmacy as: carvedilol (COREG) 6.25 MG tablet   Notes to Pharmacy: Pt must keep appointment for additional refills, Thank you.   E-Prescribing Status: Sent to pharmacy (09/05/2019  3:09 PM EDT)   Pharmacy  [id number] - [place], [place] - [address]

## 2019-09-05 NOTE — Telephone Encounter (Signed)
PAtient calling in stating he takes 6.25 mg of carvedilol 1/2 in the am and 1/2 in the pm

## 2019-09-10 NOTE — Telephone Encounter (Signed)
Called patient to verify Carvedilol dose and instructions.  No answer. No VM.

## 2019-09-26 NOTE — Progress Notes (Deleted)
Office Visit    Patient Name: Travis Morton Date of Encounter: 09/29/2019  Primary Care Provider:  Patient, No Pcp Per Primary Cardiologist:  Lorine Bears, MD  Chief Complaint    No chief complaint on file.   54 year old male with history of CAD, non-ST elevation MI 2014 s/p PCI/DES to LAD, ICM with EF 30% and subsequent normalization of EF 09/2012, hypertension, hyperlipidemia, and tobacco/alcohol use, and who presents for yearly follow-up.  Past Medical History    Past Medical History:  Diagnosis Date  . Coronary artery disease      a. 05/2012 NSTEMI/Cath/PCI: critical complex LAD stenosis, chronic total occlusion of RCA w/ L-R collaterals, moderate LCx stenosis, and severe LV dysfunction -->DES to LAD (3.0x66mm Promus Premier), PTCA first diagonal;   . ETOH abuse   . Hyperlipidemia   . Hypertension   . Ischemic cardiomyopathy    a. 05/2012 Echo: EF 30%, grade 2 diastolic dysfunction, severe anterior and anteroseptal hypokinesis, akinesis of the apical septal and apical inferior walls.  . Tobacco abuse    Past Surgical History:  Procedure Laterality Date  . CORONARY ANGIOPLASTY WITH STENT PLACEMENT     05/2012 s/p PTCA/DES to LAD, PTCA first diagonal  . LEFT HEART CATHETERIZATION WITH CORONARY ANGIOGRAM Bilateral 05/30/2012   Procedure: LEFT HEART CATHETERIZATION WITH CORONARY ANGIOGRAM;  Surgeon: Tonny Bollman, MD;  Location: [facility] CATH LAB;  Service: Cardiovascular;  Laterality: Bilateral;    Allergies  No Known Allergies  History of Present Illness    Travis Morton is a 54 y.o. male with PMH as above.  He presented to [facility] in 2014 and was found to have NSTEMI.  He was transferred to [facility] and underwent LHC that showed severe LAD and diagonal disease as well as CTO of the RCA.  He had PCI/DES as well as PTCA of the diagonal.  Echo showed EF 30% with follow-up 09/2012 echo showing EF 55-60, mild TR.  It was discovered at this time that he was able to tolerate  aspirin without issues.    He was seen again 12/14/2017 and noted to be doing well from a cardiac perspective.  He noted PM dizziness that was present since his MI in 2014 and always occurred after taking his amlodipine, carvedilol, and lisinopril.  The dizziness always persisted for 1 hour then spontaneously resolved.  It did not occur during the daytime after his AM medications.  He had not checked his blood pressure during his dizzy episodes.  He checked his blood pressure with readings 130s over 80s.  He was drinking approximately 2 Fridays per month with a 12 pack of beer.  He was smoking only when drinking alcohol.  He noted some exertional dyspnea though attributed to his decreased exercise as he was living a fairly sedentary lifestyle.  Due to his dizziness, lisinopril was decreased to 5 mg each evening and amlodipine removed from evening.  Medication regimen this included carvedilol 6.25 mg twice daily, amlodipine 5 mg every morning, lisinopril 5 mg every afternoon.  He was recommended he check his blood pressure during his dizziness episodes and increase activity as tolerated.  He was also recommended he have an updated echocardiogram and on review of EMR it does not appear that this has yet been obtained.  On 09/05/2019, he called to state he was taking half his dose of carvedilol for 6.25 mg of carvedilol total daily.  Home Medications    Prior to Admission medications   Medication Sig Start  Date End Date Taking? Authorizing Provider  amLODipine (NORVASC) 5 MG tablet Take 1 tablet by mouth once daily 06/19/19   Iran Ouch, MD  aspirin 81 MG tablet Take 81 mg by mouth daily.    [provider]  carvedilol (COREG) 6.25 MG tablet Take 1/2 (one-half) tablet by mouth twice daily 07/29/19   Iran Ouch, MD  carvedilol (COREG) 6.25 MG tablet TAKE 1/2 (ONE-HALF) TABLET BY MOUTH TWICE DAILY 09/05/19   Dunn, Raymon Mutton, PA-C  lisinopril (ZESTRIL) 5 MG tablet TAKE 1 TABLET BY MOUTH ONCE DAILY  IN THE EVENING PLEASE  KEEP  SCHEDULED  APPOINTMENT  FOR  ADDITIONAL  REFILLS 09/02/19   Iran Ouch, MD  nitroGLYCERIN (NITROSTAT) 0.4 MG SL tablet Place 1 tablet (0.4 mg total) under the tongue every 5 (five) minutes as needed for chest pain (up to 3 doses). 06/01/12   Hope, Jessica A, PA-C  simvastatin (ZOCOR) 40 MG tablet TAKE 1 TABLET BY MOUTH ONCE DAILY 6 IN THE EVENING 09/02/19   Antonieta Iba, MD    Review of Systems    ***.   All other systems reviewed and are otherwise negative except as noted above.  Physical Exam    VS:  There were no vitals taken for this visit. , BMI There is no height or weight on file to calculate BMI. GEN: Well nourished, well developed, in no acute distress. HEENT: normal. Neck: Supple, no JVD, carotid bruits, or masses. Cardiac: RRR, no murmurs, rubs, or gallops. No clubbing, cyanosis, edema.  Radials/DP/PT 2+ and equal bilaterally.  Respiratory:  Respirations regular and unlabored, clear to auscultation bilaterally. GI: Soft, nontender, nondistended, BS + x 4. MS: no deformity or atrophy. Skin: warm and dry, no rash. Neuro:  Strength and sensation are intact. Psych: Normal affect.  Accessory Clinical Findings    ECG personally reviewed by me today - *** - no acute changes.  VITALS Reviewed today   Temp Readings from Last 3 Encounters:  06/01/12 98.4 F (36.9 C) (Oral)   BP Readings from Last 3 Encounters:  12/14/17 138/86  07/14/15 138/80  07/28/14 (!) 142/100   Pulse Readings from Last 3 Encounters:  12/14/17 70  07/14/15 76  07/28/14 67    Wt Readings from Last 3 Encounters:  12/14/17 218 lb 12 oz (99.2 kg)  07/14/15 217 lb 12 oz (98.8 kg)  07/28/14 215 lb 8 oz (97.8 kg)     LABS  reviewed today   CareEverwhere Labs present and most recent? {Yes/No:[id number]:::1}  Lab Results  Component Value Date   WBC 10.3 12/14/2017   HGB 13.0 12/14/2017   HCT 38.6 12/14/2017   MCV 86 12/14/2017   PLT 304 12/14/2017   Lab  Results  Component Value Date   CREATININE 0.92 12/14/2017   BUN 13 12/14/2017   NA 141 12/14/2017   K 3.9 12/14/2017   CL 103 12/14/2017   CO2 22 12/14/2017   Lab Results  Component Value Date   ALT 20 07/22/2015   AST 16 07/22/2015   ALKPHOS 74 07/22/2015   BILITOT 0.6 07/22/2015   Lab Results  Component Value Date   CHOL 147 12/14/2017   HDL 39 (L) 12/14/2017   LDLCALC 70 12/14/2017   TRIG 189 (H) 12/14/2017   CHOLHDL 3.8 12/14/2017    No results found for: HGBA1C No results found for: TSH   STUDIES/PROCEDURES reviewed today   Echo 09/2012 - Left ventricle: The cavity size was normal. Wall thickness  was normal. Systolic function was normal. The estimated  ejection fraction was in the range of 55% to 60%. Wall  motion was normal; there were no regional wall motion  abnormalities. Left ventricular diastolic function  parameters were normal.  - Tricuspid valve: Mild regurgitation.  - Pulmonary arteries: Systolic pressure was within the  normal range.    Assessment & Plan    ***  Medication changes: *** Labs ordered: *** Studies / Imaging ordered: *** Future considerations: *** Disposition: ***  Total time spent with patient today *** minutes. This includes reviewing records, evaluating the patient, and coordinating care. Face-to-face time >50%.    Arvil Chaco, PA-C 09/29/2019

## 2019-09-30 ENCOUNTER — Ambulatory Visit: Payer: Self-pay | Admitting: Physician Assistant

## 2019-10-15 ENCOUNTER — Other Ambulatory Visit: Payer: Self-pay | Admitting: Cardiovascular Disease

## 2019-10-15 DIAGNOSIS — I10 Essential (primary) hypertension: Secondary | ICD-10-CM

## 2019-10-15 DIAGNOSIS — I255 Ischemic cardiomyopathy: Secondary | ICD-10-CM

## 2019-10-22 ENCOUNTER — Other Ambulatory Visit: Payer: Self-pay | Admitting: Physician Assistant

## 2019-11-26 ENCOUNTER — Other Ambulatory Visit: Payer: Self-pay | Admitting: Cardiovascular Disease

## 2019-11-26 DIAGNOSIS — I10 Essential (primary) hypertension: Secondary | ICD-10-CM

## 2019-11-26 DIAGNOSIS — I255 Ischemic cardiomyopathy: Secondary | ICD-10-CM

## 2019-12-04 ENCOUNTER — Other Ambulatory Visit: Payer: Self-pay | Admitting: Cardiovascular Disease

## 2019-12-05 NOTE — Telephone Encounter (Signed)
*  Please disregard previous staff message wrong encounter type*  Pt overdue for 1 month f/u LS 11/2018.  Pt has hx of scheduling and cancelling appointment for refills.  Please advise if ok to refill.  Pt scheduled for appointment with Michaelle Birks 12/09/2019.

## 2019-12-09 ENCOUNTER — Ambulatory Visit: Payer: Self-pay | Admitting: Physician Assistant

## 2019-12-09 ENCOUNTER — Other Ambulatory Visit: Payer: Self-pay

## 2019-12-09 NOTE — Progress Notes (Deleted)
Office Visit    Patient Name: Travis Morton Date of Encounter: 12/09/2019  Primary Care Provider:  Patient, No Pcp Per Primary Cardiologist:  Lorine Bears, MD  Chief Complaint    No chief complaint on file.   54 year old male with history of CAD, non-ST elevation MI 2014 s/p PCI/DES to LAD, ICM with EF 30% and subsequent normalization of EF 09/2012, hypertension, hyperlipidemia, and tobacco/alcohol use, and who presents for yearly follow-up.  Past Medical History    Past Medical History:  Diagnosis Date  . Coronary artery disease      a. 05/2012 NSTEMI/Cath/PCI: critical complex LAD stenosis, chronic total occlusion of RCA w/ L-R collaterals, moderate LCx stenosis, and severe LV dysfunction -->DES to LAD (3.0x56mm Promus Premier), PTCA first diagonal;   . ETOH abuse   . Hyperlipidemia   . Hypertension   . Ischemic cardiomyopathy    a. 05/2012 Echo: EF 30%, grade 2 diastolic dysfunction, severe anterior and anteroseptal hypokinesis, akinesis of the apical septal and apical inferior walls.  . Tobacco abuse    Past Surgical History:  Procedure Laterality Date  . CORONARY ANGIOPLASTY WITH STENT PLACEMENT     05/2012 s/p PTCA/DES to LAD, PTCA first diagonal  . LEFT HEART CATHETERIZATION WITH CORONARY ANGIOGRAM Bilateral 05/30/2012   Procedure: LEFT HEART CATHETERIZATION WITH CORONARY ANGIOGRAM;  Surgeon: Tonny Bollman, MD;  Location: [facility] CATH LAB;  Service: Cardiovascular;  Laterality: Bilateral;    Allergies  No Known Allergies  History of Present Illness    Travis Morton is a 54 y.o. male with PMH as above.   He presented to [facility] in 2014 and was found to have NSTEMI.  He was transferred to [facility] and underwent LHC that showed severe LAD and diagonal disease as well as CTO of the RCA.  He had PCI/DES as well as PTCA of the diagonal.  Echo showed EF 30% with follow-up 09/2012 echo showing EF 55-60, mild TR.  It was discovered at this time that he was able to  tolerate aspirin without issues.    He was seen again 12/14/2017 and noted to be doing well from a cardiac perspective.  He noted PM dizziness that was present since his MI in 2014 and always occurred after taking his amlodipine, carvedilol, and lisinopril.  The dizziness always persisted for 1 hour then spontaneously resolved.  It did not occur during the daytime after his AM medications.  He had not checked his blood pressure during his dizzy episodes.  He checked his blood pressure with readings 130s over 80s.  He was drinking approximately 2 Fridays per month with a 12 pack of beer.  He was smoking only when drinking alcohol.  He noted some exertional dyspnea though attributed to his decreased exercise as he was living a fairly sedentary lifestyle.  Due to his dizziness, lisinopril was decreased to 5 mg each evening and amlodipine removed from evening.  Medication regimen this included carvedilol 6.25 mg twice daily, amlodipine 5 mg every morning, lisinopril 5 mg every afternoon.  He was recommended he check his blood pressure during his dizziness episodes and increase activity as tolerated.  He was also recommended he have an updated echocardiogram and on review of EMR it does not appear that this has yet been obtained.  On 09/05/2019, he called to state he was taking half his dose of carvedilol for 6.25 mg of carvedilol total daily.  Home Medications    Prior to Admission medications   Medication Sig  Start Date End Date Taking? Authorizing Provider  amLODipine (NORVASC) 5 MG tablet Take 1 tablet by mouth once daily 06/19/19   Iran Ouch, MD  aspirin 81 MG tablet Take 81 mg by mouth daily.    [provider]  carvedilol (COREG) 6.25 MG tablet Take 1/2 (one-half) tablet by mouth twice daily 07/29/19   Iran Ouch, MD  carvedilol (COREG) 6.25 MG tablet Take 1/2 (one-half) tablet by mouth twice daily 10/22/19   Dunn, Raymon Mutton, PA-C  lisinopril (ZESTRIL) 5 MG tablet TAKE 1 TABLET BY MOUTH  ONCE DAILY IN THE EVENING PLEASE  KEEP  SCHEDULED  APPOINTMENT  FOR  ADDITIONAL  REFILLS 11/26/19   Marisue Ivan D, PA-C  nitroGLYCERIN (NITROSTAT) 0.4 MG SL tablet Place 1 tablet (0.4 mg total) under the tongue every 5 (five) minutes as needed for chest pain (up to 3 doses). 06/01/12   Hope, Jessica A, PA-C  simvastatin (ZOCOR) 40 MG tablet TAKE 1 TABLET BY MOUTH ONCE DAILY 6 IN THE EVENING 09/02/19   Antonieta Iba, MD    Review of Systems    ***.   All other systems reviewed and are otherwise negative except as noted above.  Physical Exam    VS:  There were no vitals taken for this visit. , BMI There is no height or weight on file to calculate BMI. GEN: Well nourished, well developed, in no acute distress. HEENT: normal. Neck: Supple, no JVD, carotid bruits, or masses. Cardiac: RRR, no murmurs, rubs, or gallops. No clubbing, cyanosis, edema.  Radials/DP/PT 2+ and equal bilaterally.  Respiratory:  Respirations regular and unlabored, clear to auscultation bilaterally. GI: Soft, nontender, nondistended, BS + x 4. MS: no deformity or atrophy. Skin: warm and dry, no rash. Neuro:  Strength and sensation are intact. Psych: Normal affect.  Accessory Clinical Findings    ECG personally reviewed by me today - *** - no acute changes.  VITALS Reviewed today   Temp Readings from Last 3 Encounters:  06/01/12 98.4 F (36.9 C) (Oral)   BP Readings from Last 3 Encounters:  12/14/17 138/86  07/14/15 138/80  07/28/14 (!) 142/100   Pulse Readings from Last 3 Encounters:  12/14/17 70  07/14/15 76  07/28/14 67    Wt Readings from Last 3 Encounters:  12/14/17 218 lb 12 oz (99.2 kg)  07/14/15 217 lb 12 oz (98.8 kg)  07/28/14 215 lb 8 oz (97.8 kg)     LABS  reviewed today   Lab Results  Component Value Date   WBC 10.3 12/14/2017   HGB 13.0 12/14/2017   HCT 38.6 12/14/2017   MCV 86 12/14/2017   PLT 304 12/14/2017   Lab Results  Component Value Date   CREATININE 0.92  12/14/2017   BUN 13 12/14/2017   NA 141 12/14/2017   K 3.9 12/14/2017   CL 103 12/14/2017   CO2 22 12/14/2017   Lab Results  Component Value Date   ALT 20 07/22/2015   AST 16 07/22/2015   ALKPHOS 74 07/22/2015   BILITOT 0.6 07/22/2015   Lab Results  Component Value Date   CHOL 147 12/14/2017   HDL 39 (L) 12/14/2017   LDLCALC 70 12/14/2017   TRIG 189 (H) 12/14/2017   CHOLHDL 3.8 12/14/2017    No results found for: HGBA1C No results found for: TSH   STUDIES/PROCEDURES reviewed today   Echo 09/2012 - Left ventricle: The cavity size was normal. Wall thickness  was normal. Systolic function was normal.  The estimated  ejection fraction was in the range of 55% to 60%. Wall  motion was normal; there were no regional wall motion  abnormalities. Left ventricular diastolic function  parameters were normal.  - Tricuspid valve: Mild regurgitation.  - Pulmonary arteries: Systolic pressure was within the  normal range.    Assessment & Plan    ***  Medication changes: *** Labs ordered: *** Studies / Imaging ordered: *** Future considerations: *** Disposition: ***  Total time spent with patient today *** minutes. This includes reviewing records, evaluating the patient, and coordinating care. Face-to-face time >50%.    Lennon Alstrom, PA-C 12/09/2019

## 2019-12-09 NOTE — Telephone Encounter (Signed)
*  STAT* If patient is at the pharmacy, call can be transferred to refill team.   1. Which medications need to be refilled? (please list name of each medication and dose if known) Simvastatin  2. Which pharmacy/location (including street and city if local pharmacy) is medication to be sent to? [facility] [address]  3. Do they need a 30 day or 90 day supply? 30

## 2019-12-10 ENCOUNTER — Other Ambulatory Visit: Payer: Self-pay

## 2019-12-10 MED ORDER — SIMVASTATIN 40 MG PO TABS: ORAL_TABLET | ORAL | 5 refills | Status: DC

## 2019-12-10 MED ORDER — SIMVASTATIN 40 MG PO TABS: ORAL_TABLET | ORAL | 0 refills | Status: DC

## 2019-12-10 NOTE — Telephone Encounter (Signed)
Patient calling to check status - would like 90 day supply

## 2019-12-20 ENCOUNTER — Other Ambulatory Visit: Payer: Self-pay

## 2019-12-20 DIAGNOSIS — I255 Ischemic cardiomyopathy: Secondary | ICD-10-CM

## 2019-12-20 DIAGNOSIS — I10 Essential (primary) hypertension: Secondary | ICD-10-CM

## 2019-12-20 MED ORDER — LISINOPRIL 5 MG PO TABS: ORAL_TABLET | ORAL | 0 refills | Status: DC

## 2019-12-20 NOTE — Telephone Encounter (Signed)
*  STAT* If patient is at the pharmacy, call can be transferred to refill team.   1. Which medications need to be refilled? (please list name of each medication and dose if known) LISINOPRIL  2. Which pharmacy/location (including street and city if local pharmacy) is medication to be sent to? [facility] [address]  3. Do they need a 30 day or 90 day supply? 90

## 2019-12-26 ENCOUNTER — Ambulatory Visit (INDEPENDENT_AMBULATORY_CARE_PROVIDER_SITE_OTHER): Payer: Self-pay | Admitting: Physician Assistant

## 2019-12-26 ENCOUNTER — Other Ambulatory Visit: Payer: Self-pay

## 2019-12-26 ENCOUNTER — Encounter: Payer: Self-pay | Admitting: Physician Assistant

## 2019-12-26 VITALS — BP 158/118 | HR 76 | Ht 72.0 in | Wt 233.4 lb

## 2019-12-26 DIAGNOSIS — Z79899 Other long term (current) drug therapy: Secondary | ICD-10-CM

## 2019-12-26 DIAGNOSIS — I10 Essential (primary) hypertension: Secondary | ICD-10-CM

## 2019-12-26 DIAGNOSIS — Z87898 Personal history of other specified conditions: Secondary | ICD-10-CM

## 2019-12-26 DIAGNOSIS — E785 Hyperlipidemia, unspecified: Secondary | ICD-10-CM

## 2019-12-26 DIAGNOSIS — I2510 Atherosclerotic heart disease of native coronary artery without angina pectoris: Secondary | ICD-10-CM

## 2019-12-26 DIAGNOSIS — I739 Peripheral vascular disease, unspecified: Secondary | ICD-10-CM

## 2019-12-26 DIAGNOSIS — Z87891 Personal history of nicotine dependence: Secondary | ICD-10-CM

## 2019-12-26 DIAGNOSIS — I255 Ischemic cardiomyopathy: Secondary | ICD-10-CM

## 2019-12-26 MED ORDER — CARVEDILOL 6.25 MG PO TABS: 3.1250 mg | ORAL_TABLET | Freq: Two times a day (BID) | ORAL | 3 refills | Status: DC

## 2019-12-26 MED ORDER — LISINOPRIL 20 MG PO TABS: 20.0000 mg | ORAL_TABLET | Freq: Every day | ORAL | 1 refills | Status: DC

## 2019-12-26 MED ORDER — SIMVASTATIN 40 MG PO TABS: ORAL_TABLET | ORAL | 3 refills | Status: DC

## 2019-12-26 MED ORDER — NITROGLYCERIN 0.4 MG SL SUBL: 0.4000 mg | SUBLINGUAL_TABLET | SUBLINGUAL | 3 refills | Status: DC | PRN

## 2019-12-26 NOTE — Patient Instructions (Signed)
Medication Instructions:  - Your physician has recommended you make the following change in your medication:   1) INCREASE lisinopril to 20 mg- take 1 tablet by mouth once daily   *If you need a refill on your cardiac medications before your next appointment, please call your pharmacy*   Lab Work: - Your physician recommends that you return for lab work in: 1 week (around 01/02/20) - BMP  - come to the [facility] Mall Entrance at [facility] - 1st desk on the right to check in (just past the screening table) - Lab hours: Monday- Friday (7:30 am- 5:30 pm)  If you have labs (blood work) drawn today and your tests are completely normal, you will receive your results only by: Marland Kitchen MyChart Message (if you have MyChart) OR . A paper copy in the mail If you have any lab test that is abnormal or we need to change your treatment, we will call you to review the results.   Testing/Procedures: - Your physician has requested that you have an echocardiogram. Echocardiography is a painless test that uses sound waves to create images of your heart. It provides your doctor with information about the size and shape of your heart and how well your heart's chambers and valves are working. This procedure takes approximately one hour. There are no restrictions for this procedure. An IV may need to be started during your test to inject an image enhancing agent for optimal pictures of your heart. Please be sure to hydrate prior to coming in for the test.    Follow-Up: At [facility], you and your health needs are our priority.  As part of our continuing mission to provide you with exceptional heart care, we have created designated Provider Care Teams.  These Care Teams include your primary Cardiologist (physician) and Advanced Practice Providers (APPs -  Physician Assistants and Nurse Practitioners) who all work together to provide you with the care you need, when you need it.  We recommend signing up for the patient portal  called "MyChart".  Sign up information is provided on this After Visit Summary.  MyChart is used to connect with patients for Virtual Visits (Telemedicine).  Patients are able to view lab/test results, encounter notes, upcoming appointments, etc.  Non-urgent messages can be sent to your provider as well.   To learn more about what you can do with MyChart, go to [email or web address].    Your next appointment:   1 year(s)  The format for your next appointment:   In Person  Provider:    You may see Lorine Bears, MD or one of the following Advanced Practice Providers on your designated Care Team:    Nicolasa Ducking, NP  Eula Listen, PA-C  Marisue Ivan, PA-C  Gillian Shields, NP    Other Instructions   Echocardiogram An echocardiogram is a procedure that uses painless sound waves (ultrasound) to produce an image of the heart. Images from an echocardiogram can provide important information about:  Signs of coronary artery disease (CAD).  Aneurysm detection. An aneurysm is a weak or damaged part of an artery wall that bulges out from the normal force of blood pumping through the body.  Heart size and shape. Changes in the size or shape of the heart can be associated with certain conditions, including heart failure, aneurysm, and CAD.  Heart muscle function.  Heart valve function.  Signs of a past heart attack.  Fluid buildup around the heart.  Thickening of the heart muscle.  A tumor  or infectious growth around the heart valves. Tell a health care provider about:  Any allergies you have.  All medicines you are taking, including vitamins, herbs, eye drops, creams, and over-the-counter medicines.  Any blood disorders you have.  Any surgeries you have had.  Any medical conditions you have.  Whether you are pregnant or may be pregnant. What are the risks? Generally, this is a safe procedure. However, problems may occur, including:  Allergic reaction to dye  (contrast) that may be used during the procedure. What happens before the procedure? No specific preparation is needed. You may eat and drink normally. What happens during the procedure?   An IV tube may be inserted into one of your veins.  You may receive contrast through this tube. A contrast is an injection that improves the quality of the pictures from your heart.  A gel will be applied to your chest.  A wand-like tool (transducer) will be moved over your chest. The gel will help to transmit the sound waves from the transducer.  The sound waves will harmlessly bounce off of your heart to allow the heart images to be captured in real-time motion. The images will be recorded on a computer. The procedure may vary among health care providers and hospitals. What happens after the procedure?  You may return to your normal, everyday life, including diet, activities, and medicines, unless your health care provider tells you not to do that. Summary  An echocardiogram is a procedure that uses painless sound waves (ultrasound) to produce an image of the heart.  Images from an echocardiogram can provide important information about the size and shape of your heart, heart muscle function, heart valve function, and fluid buildup around your heart.  You do not need to do anything to prepare before this procedure. You may eat and drink normally.  After the echocardiogram is completed, you may return to your normal, everyday life, unless your health care provider tells you not to do that. This information is not intended to replace advice given to you by your health care provider. Make sure you discuss any questions you have with your health care provider. Document Revised: 09/06/2018 Document Reviewed: 06/18/2016 Elsevier Patient Education  2020 [employer].

## 2019-12-26 NOTE — Progress Notes (Signed)
Office Visit    Patient Name: Travis Morton Date of Encounter: 12/29/2019  Primary Care Provider:  Patient, No Pcp Per Primary Cardiologist:  Lorine Bears, MD  Chief Complaint    Chief Complaint  Patient presents with  . OTHER    OD 1 month f/u c/o d/c amlodipine due to joint pain/muscle pain x2 months. Meds reviewed verbally with pt.    54 year old male with history of CAD with non-ST elevation myocardial infarction in 2014 s/p PCI/DES to the LAD, ICM, EF 30% with normalization of EF by echo 09/2012, hypertension, hyperlipidemia, tobacco/alcohol use, and who presents for 1 month follow-up of his CAD and ICM.   Past Medical History    Past Medical History:  Diagnosis Date  . Coronary artery disease      a. 05/2012 NSTEMI/Cath/PCI: critical complex LAD stenosis, chronic total occlusion of RCA w/ L-R collaterals, moderate LCx stenosis, and severe LV dysfunction -->DES to LAD (3.0x57mm Promus Premier), PTCA first diagonal;   . ETOH abuse   . Hyperlipidemia   . Hypertension   . Ischemic cardiomyopathy    a. 05/2012 Echo: EF 30%, grade 2 diastolic dysfunction, severe anterior and anteroseptal hypokinesis, akinesis of the apical septal and apical inferior walls.  . Tobacco abuse    Past Surgical History:  Procedure Laterality Date  . CORONARY ANGIOPLASTY WITH STENT PLACEMENT     05/2012 s/p PTCA/DES to LAD, PTCA first diagonal  . LEFT HEART CATHETERIZATION WITH CORONARY ANGIOGRAM Bilateral 05/30/2012   Procedure: LEFT HEART CATHETERIZATION WITH CORONARY ANGIOGRAM;  Surgeon: Travis Bollman, MD;  Location: [facility] CATH LAB;  Service: Cardiovascular;  Laterality: Bilateral;    Allergies  No Known Allergies  History of Present Illness    Travis Morton is a 54 y.o. male with PMH as above.  He has a history of CAD with non-STEMI in 2014 s/p PCI/DES to the LAD.  He presented to [facility] in 2014 with chest pain and was found to have non-STEMI.  He was transferred to [facility] and  underwent LHC that showed severe LAD and diagonal disease, as well as CTO of the RCA.  He underwent successful PCI/DES as well as PTCA of the diagonal.  Echo during this admission showed EF 30%.  Follow-up echo 09/2012 showed normal LV SF with EF 55 to 60% and mild TR.  Initially, he reported an allergy to aspirin.  It was subsequently determined his allergy was to another medication and he was started on ASA 81 mg.  Following his MI, he decreased his tobacco use, though he is still noted to smoke and drink beer as of his last follow-up.  He was last seen in clinic 12/14/2017 by Eula Listen, PA-C.  At that time, he reported doing well from a cardiac perspective.  He noted dizziness that occurred in the evening hours after taking his amlodipine, p.m. doses of carvedilol, and lisinopril.  The dizziness was noted to last for approximately 1 hour then spontaneously resolved.  He did not experience this dizziness during the daytime when taking his morning dose of carvedilol.  He had not checked his blood pressure during these dizzy episodes.  When he occasionally checked his BP at home, he typically noted SBP 130 with DBP 80s.  He reported drinking approximately 2 Fridays per month with a 12 pack of beer during this time.  He reported smoking only when drinking alcohol.  He did note some exertional dyspnea, attributed to decreased exercise and fairly sedentary lifestyle.  Given his dizziness, lisinopril was decreased to 5 mg each evening.  Amlodipine was moved from the evening to the morning.  Medication regimen then include carvedilol 6.25 mg in the morning and evening, amlodipine q. AM, lisinopril q. PM (afternoon).  He was instructed to let the office know if he continued to feel dizzy with this regimen.  He was also recommended to check his blood pressure during these episodes.  It was noted that he was euvolemic on exam with no indication for initiation of diuretic therapy.  Echo was scheduled but not yet  obtained.  Today, 12/26/2018, he returns to clinic and notes he has been doing well from a cardiac standpoint.  He denies any chest pain, racing heart rate, palpitations, presyncope, or syncope.  He denies any further dizziness.  He notes that he has discontinued amlodipine, as this was causing both dizziness and nausea.  He continues to drink alcohol reporting a sixpack of beer once per week.  He reports left lower extremity claudication symptoms that occur when walking and alleviated with rest.  BP in clinic today 158/118 with patient reporting that he is not taking his amlodipine as above for the last 3 months.  He also notes having [place] in a higher salt diet in the days leading up to his appointment.  Several options for BP management were discussed with recommendations as below.  Also reviewed were low-salt diet and fluid restriction under 2 L.  He is agreeable to obtaining an echocardiogram, as well as a lower extremity study for his claudication symptoms.  He denies any signs or symptoms of bleeding.  He otherwise reports medication compliance.  Home Medications    Prior to Admission medications   Medication Sig Start Date End Date Taking? Authorizing Provider  aspirin 81 MG tablet Take 81 mg by mouth daily.   Yes [provider]  carvedilol (COREG) 6.25 MG tablet Take 0.5 tablets (3.125 mg total) by mouth 2 (two) times daily with a meal. 12/26/19  Yes Bevin Mayall D, PA-C  nitroGLYCERIN (NITROSTAT) 0.4 MG SL tablet Place 1 tablet (0.4 mg total) under the tongue every 5 (five) minutes as needed for chest pain (up to 3 doses). 12/26/19  Yes Mairead Schwarzkopf D, PA-C  simvastatin (ZOCOR) 40 MG tablet TAKE 1 TABLET BY MOUTH ONCE DAILY 6 IN THE EVENING 12/26/19  Yes Shanesha Bednarz D, PA-C  lisinopril (ZESTRIL) 20 MG tablet Take 1 tablet (20 mg total) by mouth daily. 12/26/19 03/25/20  Marisue Ivan D, PA-C    Review of Systems    He denies chest pain, palpitations, dyspnea,  pnd, orthopnea, n, v, dizziness, syncope, edema, weight gain, or early satiety.  He reports resolution of nausea and dizziness with cessation of amlodipine for the last 3 months.  He reports elevated pressures in the setting of high salt diet.  He reports left lower extremity claudication symptoms that start with ambulation and alleviated with rest.   All other systems reviewed and are otherwise negative except as noted above.  Physical Exam    VS:  BP (!) 158/118 (BP Location: Left Arm, Patient Position: Sitting, Cuff Size: Normal)   Pulse 76   Ht 6' (1.829 m)   Wt (!) 233 lb 6 oz (105.9 kg)   BMI 31.65 kg/m  , BMI Body mass index is 31.65 kg/m. GEN: Well nourished, well developed, in no acute distress. HEENT: normal. Neck: Supple, no JVD, carotid bruits, or masses. Cardiac: RRR, 1/6 systolc murmur. No rubs or gallops.  No clubbing, cyanosis. Trace to mild bilateral LE edema.  Radials/DP/PT 2+ and equal bilaterally.  Respiratory:  Respirations regular and unlabored, clear to auscultation bilaterally. GI: Soft, nontender, nondistended, BS + x 4. MS: no deformity or atrophy. Skin: warm and dry, no rash. Neuro:  Strength and sensation are intact. Psych: Normal affect.  Accessory Clinical Findings    ECG personally reviewed by me today - NSR, LAD, poor R wave progression in inferior leads, nonspecific changes in the precordial leads - no acute changes.  VITALS Reviewed today   Temp Readings from Last 3 Encounters:  06/01/12 98.4 F (36.9 C) (Oral)   BP Readings from Last 3 Encounters:  12/26/19 (!) 158/118  12/14/17 138/86  07/14/15 138/80   Pulse Readings from Last 3 Encounters:  12/26/19 76  12/14/17 70  07/14/15 76    Wt Readings from Last 3 Encounters:  12/26/19 (!) 233 lb 6 oz (105.9 kg)  12/14/17 218 lb 12 oz (99.2 kg)  07/14/15 217 lb 12 oz (98.8 kg)     LABS  reviewed today   Lab Results  Component Value Date   WBC 10.3 12/14/2017   HGB 13.0 12/14/2017   HCT  38.6 12/14/2017   MCV 86 12/14/2017   PLT 304 12/14/2017   Lab Results  Component Value Date   CREATININE 0.92 12/14/2017   BUN 13 12/14/2017   NA 141 12/14/2017   K 3.9 12/14/2017   CL 103 12/14/2017   CO2 22 12/14/2017   Lab Results  Component Value Date   ALT 20 07/22/2015   AST 16 07/22/2015   ALKPHOS 74 07/22/2015   BILITOT 0.6 07/22/2015   Lab Results  Component Value Date   CHOL 147 12/14/2017   HDL 39 (L) 12/14/2017   LDLCALC 70 12/14/2017   TRIG 189 (H) 12/14/2017   CHOLHDL 3.8 12/14/2017    No results found for: HGBA1C No results found for: TSH   STUDIES/PROCEDURES reviewed today   Echo 09/2012 - Left ventricle: The cavity size was normal. Wall thickness  was normal. Systolic function was normal. The estimated  ejection fraction was in the range of 55% to 60%. Wall  motion was normal; there were no regional wall motion  abnormalities. Left ventricular diastolic function  parameters were normal.  - Tricuspid valve: Mild regurgitation.  - Pulmonary arteries: Systolic pressure was within the  normal range.   Assessment & Plan    CAD involving the native coronary arteries without angina --No chest pain.  No symptoms concerning for angina.  Continue current medical therapy.  AggrCAD involving the native coronary arteries without anginaessive risk factor modification and secondary prevention.  Strong recommendation for BP control.  Given he can no longer tolerate amlodipine, will escalate lisinopril from 5mg  to 20 mg daily.  Check BMET today and in 1 week.  Increase activity as tolerated.  Obtain echo, as this has not been done since recommendation at 11/2017 visit.  Dizziness, resolved --At previous visit, antihypertensive regimen rearranged to help assist with dizziness.  Since that time, he has determined that he cannot tolerate amlodipine due to dizziness and nausea.  BP today uncontrolled at 158/118.  Will increase lisinopril from 5 mg to 20 mg  with BMET today and in 1 week.  Heart rate today 76 with some room for escalation of Coreg if needed for additional BP support.  Continue to monitor blood pressure at home.  If dizziness returns, recommendation is that he check his blood pressure at that  time.    ICM --As above, initially significant reduction in EF with normalization of EF in 2014.  He appears euvolemic and well compensated on exam.  Continue Coreg and lisinopril.  No indication for initiation of standing diuretic at this time.  Strong recommendation for BP control.  Check echo as above.  HTN, poorly controlled --Medication changes as above. He has not taken amlodipine for 3 months. Check BMET today and in 1 week from increase of lisinopril 5mg  to 20mg . Could also escalate Coreg as tolerated or up-titrate lisinopril further if more BP support needed at RTC. Goal BP 130/80. Low salt diet. Monitor pressures at home and call if consistently over goal pressure.    Dyspnea --Continues to note dyspnea, attributed to sedentary lifestyle and deconditioning.  Schedule echo.  Continue to try to escalate activity as tolerated.  HLD --Continue statin.  Left leg claudication symptoms --Left lower extremity arterial study recommended. Education provided regarding PAD.  Tobacco / alcohol use --Complete cessation advised.   Medication changes: Increase to lisinopril 20mg  daily Labs ordered: BMET today and in 1 week Studies / Imaging ordered: Echo, ABI/LE study Disposition: RTC based on results of studies and home BP given self pay     , PA-C

## 2019-12-31 ENCOUNTER — Telehealth: Payer: Self-pay | Admitting: Physician Assistant

## 2019-12-31 NOTE — Telephone Encounter (Signed)
The patient had an office visit on 12/26/19 with Marisue Ivan, PA. After the patient left the office, Jacquelyn advised the patient needed to be set up for ABI's w/ LE arterial duplex for left leg claudication.  I attempted to call the patient to inform him Harvel Quale would like to order this test for him. No answer & no voice mail at the home #. No answer & left a message to please call back at the cell #.   Orders have not been placed yet until patient consent is obtained.

## 2019-12-31 NOTE — Telephone Encounter (Signed)
The patient called back. I discussed ABI's & LE arterial testing with him. Per the patient, he would like to think about this for a little bit before scheduling.  I have advised him if he would like to schedule these tests at any time to please call back and we can set these up for him.   The patient voices understanding and is agreeable.

## 2020-01-23 ENCOUNTER — Other Ambulatory Visit: Payer: Self-pay

## 2020-06-16 ENCOUNTER — Other Ambulatory Visit: Payer: Self-pay

## 2020-09-25 ENCOUNTER — Telehealth: Payer: Self-pay | Admitting: Cardiovascular Disease

## 2020-09-25 MED ORDER — LISINOPRIL 20 MG PO TABS: 20.0000 mg | ORAL_TABLET | Freq: Every day | ORAL | 1 refills | Status: DC

## 2020-09-25 NOTE — Telephone Encounter (Signed)
*  STAT* If patient is at the pharmacy, call can be transferred to refill team.   1. Which medications need to be refilled? (please list name of each medication and dose if known) lisinopril 20 MG 1 tablet daily   2. Which pharmacy/location (including street and city if local pharmacy) is medication to be sent to? [facility] [address]   3. Do they need a 30 day or 90 day supply? 90 day

## 2020-09-25 NOTE — Telephone Encounter (Signed)
lisinopril (ZESTRIL) 20 MG tablet [[id number]]   Order Details Dose: 20 mg Route: Oral Frequency: Daily  Dispense Quantity: 90 tablet Refills: 1   Note to Pharmacy: Dose increase      Sig: Take 1 tablet (20 mg total) by mouth daily.      Start Date: 09/25/20 End Date: 12/24/20 after 90 doses  Written Date: 09/25/20 Expiration Date: 09/25/21  Original Order:  lisinopril (ZESTRIL) 20 MG tablet [[id number]]   Providers  Ordering and Authorizing Provider:   Antonieta Iba, MD  [address], [place] [address]  Phone:  [phone]  Fax:  [phone]  DEA #:  [id number]  NPI:  [id number]     Ordering User:  Jeanene Erb       Pharmacy  [facility] [address] [place] - [address]  [address] [place] [address]  Phone:  [phone] Fax:  [phone]  DEA #:  --  DAW Reason: --

## 2020-10-21 ENCOUNTER — Telehealth: Payer: Self-pay | Admitting: Physician Assistant

## 2020-10-21 NOTE — Telephone Encounter (Signed)
Unable to reach after several attempts to schedule testing .  Mailed letter removing order. 

## 2020-12-24 ENCOUNTER — Telehealth: Payer: Self-pay | Admitting: Cardiovascular Disease

## 2020-12-24 NOTE — Telephone Encounter (Signed)
-----   Message from Travis Morton sent at 12/23/2020 10:59 AM EDT ----- Regarding: echo Patient has F/U appointment on 01/01/2021 with JV but has not had echo performed. Can you please reschedule this? Thanks!

## 2020-12-24 NOTE — Telephone Encounter (Signed)
No answer, no VM

## 2020-12-28 ENCOUNTER — Other Ambulatory Visit: Payer: Self-pay | Admitting: *Deleted

## 2020-12-28 MED ORDER — SIMVASTATIN 40 MG PO TABS: ORAL_TABLET | ORAL | 3 refills | Status: DC

## 2020-12-29 ENCOUNTER — Other Ambulatory Visit: Payer: Self-pay

## 2020-12-29 MED ORDER — SIMVASTATIN 40 MG PO TABS: ORAL_TABLET | ORAL | 0 refills | Status: DC

## 2020-12-29 MED ORDER — LISINOPRIL 20 MG PO TABS: 20.0000 mg | ORAL_TABLET | Freq: Every day | ORAL | 0 refills | Status: DC

## 2020-12-29 MED ORDER — CARVEDILOL 6.25 MG PO TABS: 3.1250 mg | ORAL_TABLET | Freq: Two times a day (BID) | ORAL | 0 refills | Status: DC

## 2020-12-29 NOTE — Telephone Encounter (Signed)
*  STAT* If patient is at the pharmacy, call can be transferred to refill team.   1. Which medications need to be refilled? (please list name of each medication and dose if known) Simvastatin 40 mg, Lisinopril 20 mg, Carvedilol, 6.25  2. Which pharmacy/location (including street and city if local pharmacy) is medication to be sent to? [facility] [place]  3. Do they need a 30 day or 90 day supply? 90

## 2021-01-01 ENCOUNTER — Ambulatory Visit: Payer: Self-pay | Admitting: Physician Assistant

## 2021-01-12 ENCOUNTER — Telehealth: Payer: Self-pay | Admitting: Cardiovascular Disease

## 2021-01-12 MED ORDER — CARVEDILOL 6.25 MG PO TABS: 3.1250 mg | ORAL_TABLET | Freq: Two times a day (BID) | ORAL | 1 refills | Status: DC

## 2021-01-12 NOTE — Telephone Encounter (Signed)
Pt must keep future appointment for long term refills.  Requested Prescriptions   Signed Prescriptions Disp Refills   carvedilol (COREG) 6.25 MG tablet 30 tablet 1    Sig: Take 0.5 tablets (3.125 mg total) by mouth 2 (two) times daily with a meal.    Authorizing Provider: Marisue Ivan D    Ordering User: Kendrick Fries

## 2021-01-12 NOTE — Telephone Encounter (Signed)
*  STAT* If patient is at the pharmacy, call can be transferred to refill team.   1. Which medications need to be refilled? (please list name of each medication and dose if known)    Carvedilol 3.125 mg po BID  2. Which pharmacy/location (including street and city if local pharmacy) is medication to be sent to?    [facility] [address] [place]   3. Do they need a 30 day or 90 day supply? 90

## 2021-01-13 ENCOUNTER — Other Ambulatory Visit: Payer: Self-pay

## 2021-01-13 MED ORDER — CARVEDILOL 6.25 MG PO TABS: 3.1250 mg | ORAL_TABLET | Freq: Two times a day (BID) | ORAL | 1 refills | Status: DC

## 2021-02-17 ENCOUNTER — Other Ambulatory Visit: Payer: Self-pay

## 2021-02-22 ENCOUNTER — Ambulatory Visit: Payer: Self-pay | Admitting: Physician Assistant

## 2021-03-22 ENCOUNTER — Telehealth: Payer: Self-pay | Admitting: Cardiovascular Disease

## 2021-03-22 NOTE — Telephone Encounter (Signed)
*  STAT* If patient is at the pharmacy, call can be transferred to refill team.   1. Which medications need to be refilled? (please list name of each medication and dose if known)   Carvedilol   2. Which pharmacy/location (including street and city if local pharmacy) is medication to be sent to?[facility] [address]   3. Do they need a 30 day or 90 day supply? 90

## 2021-03-22 NOTE — Telephone Encounter (Signed)
Patient last seen 7/21. Needs appt before any further refills can be given. Thank you!

## 2021-05-04 NOTE — Telephone Encounter (Signed)
Attempted to schedule no ans no vm  

## 2021-05-08 ENCOUNTER — Other Ambulatory Visit: Payer: Self-pay | Admitting: Cardiovascular Disease

## 2021-05-13 ENCOUNTER — Other Ambulatory Visit: Payer: Self-pay

## 2021-05-13 NOTE — Telephone Encounter (Signed)
Pt requesting 90 day refill overdue for 12 month f/u. Pt last seen 11/2019. Pt has hx of cancelling appointments and doesn't have appointment scheduled at the time. Fwd to scheduling for future appointment.

## 2021-05-13 NOTE — Telephone Encounter (Signed)
No ans/no VM

## 2021-05-13 NOTE — Telephone Encounter (Signed)
Pt overdue for 12 month f/u. °Please contact pt for future appointment. °

## 2021-05-13 NOTE — Telephone Encounter (Signed)
*  STAT* If patient is at the pharmacy, call can be transferred to refill team.   1. Which medications need to be refilled? (please list name of each medication and dose if known) Lisinopril and Carvedilol  2. Which pharmacy/location (including street and city if local pharmacy) is medication to be sent to? [facility] [address]  3. Do they need a 30 day or 90 day supply? 90

## 2021-08-19 ENCOUNTER — Telehealth: Payer: Self-pay | Admitting: Cardiovascular Disease

## 2021-08-19 MED ORDER — CARVEDILOL 6.25 MG PO TABS: 3.1250 mg | ORAL_TABLET | Freq: Two times a day (BID) | ORAL | 0 refills | Status: DC

## 2021-08-19 MED ORDER — LISINOPRIL 20 MG PO TABS: 20.0000 mg | ORAL_TABLET | Freq: Every day | ORAL | 0 refills | Status: DC

## 2021-08-19 NOTE — Telephone Encounter (Signed)
?*  STAT* If patient is at the pharmacy, call can be transferred to refill team. ? ? ?1. Which medications need to be refilled? (please list name of each medication and dose if known) lisinopril 20 MG 1 tablet daily ?Carvedilol 6.25 MG 0.5 tablets 2 times daily ? ? ?2. Which pharmacy/location (including street and city if local pharmacy) is medication to be sent to? [facility] [address] ? ?3. Do they need a 30 day or 90 day supply? 90 day  ? ?

## 2021-08-19 NOTE — Telephone Encounter (Signed)
Requested Prescriptions  ? ?Signed Prescriptions Disp Refills  ? lisinopril (ZESTRIL) 20 MG tablet 5 tablet 0  ?  Sig: Take 1 tablet (20 mg total) by mouth daily.  ?  Authorizing Provider: Nicolasa Ducking RONALD  ?  Ordering User: NEWCOMER MCCLAIN, Rowena Moilanen L  ? carvedilol (COREG) 6.25 MG tablet 5 tablet 0  ?  Sig: Take 0.5 tablets (3.125 mg total) by mouth 2 (two) times daily with a meal.  ?  Authorizing Provider: Nicolasa Ducking RONALD  ?  Ordering User: NEWCOMER MCCLAIN, Kamarii Carton L  ? ?Unable to refill 90 days supply due to multiple no-shows. Patient last seen in 2021. Must keep appt for further refills.  ?

## 2021-08-24 ENCOUNTER — Encounter: Payer: Self-pay | Admitting: Nurse Practitioner

## 2021-08-24 ENCOUNTER — Other Ambulatory Visit: Payer: Self-pay

## 2021-08-24 ENCOUNTER — Ambulatory Visit (INDEPENDENT_AMBULATORY_CARE_PROVIDER_SITE_OTHER): Payer: Self-pay | Admitting: Nurse Practitioner

## 2021-08-24 VITALS — BP 140/90 | HR 61 | Ht 66.0 in | Wt 222.0 lb

## 2021-08-24 DIAGNOSIS — E785 Hyperlipidemia, unspecified: Secondary | ICD-10-CM

## 2021-08-24 DIAGNOSIS — F1010 Alcohol abuse, uncomplicated: Secondary | ICD-10-CM

## 2021-08-24 DIAGNOSIS — I5022 Chronic systolic (congestive) heart failure: Secondary | ICD-10-CM

## 2021-08-24 DIAGNOSIS — I10 Essential (primary) hypertension: Secondary | ICD-10-CM

## 2021-08-24 DIAGNOSIS — Z720 Tobacco use: Secondary | ICD-10-CM

## 2021-08-24 DIAGNOSIS — I2510 Atherosclerotic heart disease of native coronary artery without angina pectoris: Secondary | ICD-10-CM

## 2021-08-24 DIAGNOSIS — I255 Ischemic cardiomyopathy: Secondary | ICD-10-CM

## 2021-08-24 MED ORDER — CARVEDILOL 6.25 MG PO TABS: 3.1250 mg | ORAL_TABLET | Freq: Two times a day (BID) | ORAL | 3 refills | Status: DC

## 2021-08-24 MED ORDER — SIMVASTATIN 40 MG PO TABS: ORAL_TABLET | ORAL | 3 refills | Status: DC

## 2021-08-24 MED ORDER — LISINOPRIL 20 MG PO TABS: 20.0000 mg | ORAL_TABLET | Freq: Every day | ORAL | 3 refills | Status: DC

## 2021-08-24 MED ORDER — NITROGLYCERIN 0.4 MG SL SUBL: 0.4000 mg | SUBLINGUAL_TABLET | SUBLINGUAL | 0 refills | Status: AC | PRN

## 2021-08-24 NOTE — Progress Notes (Signed)
? ? ?Office Visit  ?  ?Patient Name: Travis Morton ?Date of Encounter: 08/24/2021 ? ?Primary Care Provider:  Patient, No Pcp Per (Inactive) ?Primary Cardiologist:  Lorine Bears, MD ? ?Chief Complaint  ?  ?56 year old male with a history of CAD status post non-STEMI and drug-eluting stent placement to the LAD in January 2014, hypertension, hyperlipidemia, chronic heart failure with improved ejection fraction, ischemic cardiomyopathy, tobacco abuse, and alcohol abuse, who presents for follow-up related to CAD. ? ?Past Medical History  ?  ?Past Medical History:  ?Diagnosis Date  ? Chronic HFimp(heart failure with improved ejection fraction) (HCC)   ? a. 05/2012 Echo: EF 30%; b. 09/2012 Echo: EF 55-60%.  ? Coronary artery disease    ?  a. 05/2012 NSTEMI/Cath/PCI: critical complex LAD stenosis, chronic total occlusion of RCA w/ L-R collaterals, moderate LCx stenosis, and severe LV dysfunction -->DES to LAD (3.0x63mm Promus Premier), PTCA first diagonal;   ? ETOH abuse   ? Hyperlipidemia   ? Hypertension   ? Ischemic cardiomyopathy   ? a. 05/2012 Echo: EF 30%, grade 2 diastolic dysfunction, severe anterior and anteroseptal hypokinesis, akinesis of the apical septal and apical inferior walls; b. 09/2012 Echo: EF 55-60%, no rwma, mild MR, nl PASP.  ? Tobacco abuse   ? ?Past Surgical History:  ?Procedure Laterality Date  ? CORONARY ANGIOPLASTY WITH STENT PLACEMENT    ? 05/2012 s/p PTCA/DES to LAD, PTCA first diagonal  ? LEFT HEART CATHETERIZATION WITH CORONARY ANGIOGRAM Bilateral 05/30/2012  ? Procedure: LEFT HEART CATHETERIZATION WITH CORONARY ANGIOGRAM;  Surgeon: Tonny Bollman, MD;  Location: [facility] C[facility] LAB;  Service: Cardiovascular;  Laterality: Bilateral;  ? ? ?Allergies ? ?No Known Allergies ? ?History of Present Illness  ?  ?56 year old male with the above complex past medical history including CAD, hypertension, hyperlipidemia, chronic heart failure with improved ejection fraction, ischemic cardiomyopathy, tobacco and  alcohol abuse.  In January 2014, he was admitted to [facility] with a non-STEMI.  He was transferred to [facility] and underwent diagnostic catheterization, which showed severe LAD and diagonal disease as well as a chronic total occlusion of the right coronary artery.  The LAD was treated with a drug-eluting stent while the diagonal was treated with balloon angioplasty.  Echocardiogram showed an EF of 30% with grade 2 diastolic dysfunction and severe anterior and anteroseptal hypokinesis, as well as akinesis of the apical septal and apical inferior walls.  Follow-up echo in May 2014 showed an EF of 55 to 6% without regional wall motion abnormalities.  Mr. Millstein has done reasonably well over the years.  He was last seen in cardiology clinic in July 2021, at which time he was hypertensive and reported left lower extremity claudication.  Lisinopril was increased to 20 mg daily with recommendation for lower extremity ABIs however, these have never been carried out. ? ?Over the past year and a half, Mr. Bisceglia, has done well.  He says that he remains active, exercising occasionally, without symptoms or limitations.  He continues to smoke about a pack of cigarettes a week and drinks a 12 pack of beer weekly as well.  He notes compliance with his medications but is due for refills on all of them.  He denies chest pain, palpitations, dyspnea, pnd, orthopnea, n, v, dizziness, syncope, edema, weight gain, or early satiety.  ? ?Home Medications  ?  ?Current Outpatient Medications  ?Medication Sig Dispense Refill  ? aspirin 81 MG tablet Take 81 mg by mouth daily.    ?  carvedilol (COREG) 6.25 MG tablet Take 0.5 tablets (3.125 mg total) by mouth 2 (two) times daily with a meal. 5 tablet 0  ? lisinopril (ZESTRIL) 20 MG tablet Take 1 tablet (20 mg total) by mouth daily. 5 tablet 0  ? simvastatin (ZOCOR) 40 MG tablet TAKE 1 TABLET BY MOUTH ONCE DAILY 6 IN THE EVENING. PLEASE CONTACT OFFICE 90 tablet 0  ? nitroGLYCERIN  (NITROSTAT) 0.4 MG SL tablet Place 1 tablet (0.4 mg total) under the tongue every 5 (five) minutes as needed for chest pain (up to 3 doses). 25 tablet 0  ? ?No current facility-administered medications for this visit.  ?  ? ?Review of Systems  ?  ?He denies chest pain, palpitations, dyspnea, pnd, orthopnea, n, v, dizziness, syncope, edema, weight gain, or early satiety.  All other systems reviewed and are otherwise negative except as noted above. ?  ? ?Physical Exam  ?  ?VS:  BP 140/90 (BP Location: Left Arm, Patient Position: Sitting, Cuff Size: Large)   Pulse 61   Ht 5\' 6"  (1.676 m)   Wt 222 lb (100.7 kg)   SpO2 97%   BMI 35.83 kg/m?  , BMI Body mass index is 35.83 kg/m?. ?    ?GEN: Well nourished, well developed, in no acute distress. ?HEENT: normal. ?Neck: Supple, no JVD, carotid bruits, or masses. ?Cardiac: RRR, no murmurs, rubs, or gallops. No clubbing, cyanosis, edema.  Radials/PT 2+ and equal bilaterally.  ?Respiratory:  Respirations regular and unlabored, clear to auscultation bilaterally. ?GI: Soft, nontender, nondistended, BS + x 4. ?MS: no deformity or atrophy. ?Skin: warm and dry, no rash. ?Neuro:  Strength and sensation are intact. ?Psych: Normal affect. ? ?Accessory Clinical Findings  ?  ?ECG personally reviewed by me today -regular sinus rhythm, 61, PVCs - no acute changes. ? ?Lab Results  ?Component Value Date  ? WBC 10.3 12/14/2017  ? HGB 13.0 12/14/2017  ? HCT 38.6 12/14/2017  ? MCV 86 12/14/2017  ? PLT 304 12/14/2017  ? ?Lab Results  ?Component Value Date  ? CREATININE 0.92 12/14/2017  ? BUN 13 12/14/2017  ? NA 141 12/14/2017  ? K 3.9 12/14/2017  ? CL 103 12/14/2017  ? CO2 22 12/14/2017  ? ?Lab Results  ?Component Value Date  ? ALT 20 07/22/2015  ? AST 16 07/22/2015  ? ALKPHOS 74 07/22/2015  ? BILITOT 0.6 07/22/2015  ? ?Lab Results  ?Component Value Date  ? CHOL 147 12/14/2017  ? HDL 39 (L) 12/14/2017  ? LDLCALC 70 12/14/2017  ? TRIG 189 (H) 12/14/2017  ? CHOLHDL 3.8 12/14/2017  ?   ?Assessment & Plan  ?  ?1.  Coronary artery disease: Status post prior non-STEMI in January 2014 with PCI of the LAD.  He has a chronic total occlusion of the right coronary artery with left-to-right collaterals noted at the time of his catheterization.  He has done well since his last visit in 2021 without chest pain or dyspnea.  I encouraged 30 minutes of exercise daily as well as complete smoking and alcohol cessation.  I am following up a complete metabolic panel and lipids today, and refilling his aspirin, beta-blocker, ACE inhibitor, and statin therapy. ? ?2.  Chronic heart failure with improved ejection fraction/ischemic cardiomyopathy: EF initially 30% at the time of his MI in January 2014 with subsequent improvement to 55 to 60% in May 2014.  As above, has been doing well without symptoms or limitations.  He is euvolemic on examination.  His blood pressure  is elevated this morning at 140/90 however, he has not taking his morning medications and is actually due for refills.  Follow-up labs today as outlined above with refills on beta-blocker and ACE inhibitor. ? ?3.  Essential hypertension: As above, blood pressure elevated today however, he has yet to take morning changes.  Labs today with refills on carvedilol and lisinopril. ? ?4.  Hyperlipidemia: He reports compliance with simvastatin.  Follow-up lipids and LFTs today. ? ?5.  Alcohol abuse: Drinking a 12 pack of beer a week.  Complete cessation advised. ? ?6.  Tobacco abuse: Smoking a pack of cigarettes weekly.  Complete cessation advised. ? ?7.  Disposition: Follow-up complete metabolic panel and lipids today.  Follow-up in clinic in 1 year or sooner if necessary. ? ? ?Nicolasa Duckinghristopher Aritha Huckeba, NP ?08/24/2021, 10:29 AM ? ?

## 2021-08-24 NOTE — Addendum Note (Signed)
Addended by: Valora Corporal on: 08/24/2021 10:41 AM ? ? Modules accepted: Orders ? ?

## 2021-08-24 NOTE — Patient Instructions (Signed)
Medication Instructions:  ?No changes at this time.  ? ?*If you need a refill on your cardiac medications before your next appointment, please call your pharmacy* ? ? ?Lab Work: ?CMET & Lipid panel today ? ?If you have labs (blood work) drawn today and your tests are completely normal, you will receive your results only by: ?MyChart Message (if you have MyChart) OR ?A paper copy in the mail ?If you have any lab test that is abnormal or we need to change your treatment, we will call you to review the results. ? ? ?Testing/Procedures: ?None ? ? ?Follow-Up: ?At [facility], you and your health needs are our priority.  As part of our continuing mission to provide you with exceptional heart care, we have created designated Provider Care Teams.  These Care Teams include your primary Cardiologist (physician) and Advanced Practice Providers (APPs -  Physician Assistants and Nurse Practitioners) who all work together to provide you with the care you need, when you need it. ? ?We recommend signing up for the patient portal called "MyChart".  Sign up information is provided on this After Visit Summary.  MyChart is used to connect with patients for Virtual Visits (Telemedicine).  Patients are able to view lab/test results, encounter notes, upcoming appointments, etc.  Non-urgent messages can be sent to your provider as well.   ?To learn more about what you can do with MyChart, go to [email or web address].   ? ?Your next appointment:   ?1 year(s) ? ?The format for your next appointment:   ?In Person ? ?Provider:   ?Lorine Bears, MD or Nicolasa Ducking, NP ?

## 2021-08-25 ENCOUNTER — Telehealth: Payer: Self-pay

## 2021-08-25 ENCOUNTER — Telehealth: Payer: Self-pay | Admitting: *Deleted

## 2021-08-25 LAB — LIPID PANEL
Chol/HDL Ratio: 3 ratio (ref 0.0–5.0)
Cholesterol, Total: 131 mg/dL (ref 100–199)
HDL: 43 mg/dL (ref >39)
LDL Chol Calc (NIH): 69 mg/dL (ref 0–99)
Triglycerides: 100 mg/dL (ref 0–149)
VLDL Cholesterol Cal: 19 mg/dL (ref 5–40)

## 2021-08-25 LAB — COMPREHENSIVE METABOLIC PANEL
ALT: 15 IU/L (ref 0–44)
AST: 16 IU/L (ref 0–40)
Albumin/Globulin Ratio: 1.7 (ref 1.2–2.2)
Albumin: 4.3 g/dL (ref 3.8–4.9)
Alkaline Phosphatase: 81 IU/L (ref 44–121)
BUN/Creatinine Ratio: 10 (ref 9–20)
BUN: 9 mg/dL (ref 6–24)
Bilirubin Total: 0.4 mg/dL (ref 0.0–1.2)
CO2: 24 mmol/L (ref 20–29)
Calcium: 9.3 mg/dL (ref 8.7–10.2)
Chloride: 103 mmol/L (ref 96–106)
Creatinine, Ser: 0.9 mg/dL (ref 0.76–1.27)
Globulin, Total: 2.5 g/dL (ref 1.5–4.5)
Glucose: 89 mg/dL (ref 70–99)
Potassium: 4.2 mmol/L (ref 3.5–5.2)
Sodium: 139 mmol/L (ref 134–144)
Total Protein: 6.8 g/dL (ref 6.0–8.5)
eGFR: 101 mL/min/{1.73_m2} (ref >59)

## 2021-08-25 NOTE — Telephone Encounter (Signed)
-----   Message from Theora Gianotti, NP sent at 08/25/2021 12:31 PM EDT ----- ?Kidney and liver functions are normal.  Electrolytes are normal.  LDL cholesterol is at goal at 69.  Continue current medications. ?

## 2021-08-25 NOTE — Telephone Encounter (Signed)
Left message requesting to have patient call back to verify pharmacy. Prescriptions were sent to [facility] in [facility] yesterday as requested however our office received refill requests for carvedilol and lisinopril from [facility] on [address] today. ?

## 2021-08-25 NOTE — Telephone Encounter (Signed)
Left voicemail message to call back for review of results.  

## 2021-08-26 NOTE — Telephone Encounter (Signed)
Patient returning call.

## 2021-08-27 ENCOUNTER — Other Ambulatory Visit: Payer: Self-pay | Admitting: Nurse Practitioner

## 2021-08-27 NOTE — Telephone Encounter (Signed)
Left voicemail message to call back for review of results on both home and mobile numbers.  ?

## 2021-08-27 NOTE — Telephone Encounter (Signed)
Patient came by office ?Checking status of results  ?

## 2021-08-30 NOTE — Telephone Encounter (Signed)
Attempt x4 ? ?Left detailed voicemail message with results per release form with instructions to call back if he should have any questions.  ?

## 2022-02-21 ENCOUNTER — Other Ambulatory Visit: Payer: Self-pay | Admitting: Cardiovascular Disease

## 2022-09-13 ENCOUNTER — Other Ambulatory Visit: Payer: Self-pay | Admitting: Nurse Practitioner

## 2022-09-13 NOTE — Telephone Encounter (Signed)
Please contact pt for future appointment. Pt overdue fro 12 month f/u. Pt needing refills.

## 2022-09-28 ENCOUNTER — Telehealth: Payer: Self-pay | Admitting: Cardiovascular Disease

## 2022-09-28 MED ORDER — SIMVASTATIN 40 MG PO TABS: ORAL_TABLET | ORAL | 0 refills | Status: DC

## 2022-09-28 MED ORDER — LISINOPRIL 20 MG PO TABS: 20.0000 mg | ORAL_TABLET | Freq: Every day | ORAL | 0 refills | Status: DC

## 2022-09-28 NOTE — Telephone Encounter (Signed)
Requested Prescriptions   Signed Prescriptions Disp Refills   lisinopril (ZESTRIL) 20 MG tablet 14 tablet 0    Sig: Take 1 tablet (20 mg total) by mouth daily.    Authorizing Provider: Lorine Bears A    Ordering User: Thayer Headings, Zylah Elsbernd L   simvastatin (ZOCOR) 40 MG tablet 14 tablet 0    Sig: TAKE 1 TABLET BY MOUTH ONCE DAILY AT 6PM   PLEASE KEEP OFFICE VISIT FOR FURTHER REFILLS. THANK YOU!    Authorizing Provider: Lorine Bears A    Ordering User: Thayer Headings, Kreig Parson L   No further refills until seen in office. Hx of cancellations.

## 2022-09-28 NOTE — Telephone Encounter (Signed)
*  STAT* If patient is at the pharmacy, call can be transferred to refill team.   1. Which medications need to be refilled? (please list name of each medication and dose if known)   lisinopril (ZESTRIL) 20 MG tablet   simvastatin (ZOCOR) 40 MG tablet  2. Which pharmacy/location (including street and city if local pharmacy) is medication to be sent to?[facility] [address] [place] - [address]   3. Do they need a 30 day or 90 day supply? 90 Day Supply  Pt is currently out of medication

## 2022-09-28 NOTE — Addendum Note (Signed)
Addended by: Thayer Headings, Shereen Marton L on: 09/28/2022 02:51 PM   Modules accepted: Orders

## 2022-09-28 NOTE — Addendum Note (Signed)
Addended by: Thayer Headings, Annalyssa Thune L on: 09/28/2022 02:50 PM   Modules accepted: Orders

## 2022-10-12 ENCOUNTER — Ambulatory Visit: Payer: Self-pay | Attending: Nurse Practitioner | Admitting: Nurse Practitioner

## 2022-10-12 ENCOUNTER — Encounter: Payer: Self-pay | Admitting: Nurse Practitioner

## 2022-10-12 VITALS — BP 128/80 | HR 68 | Ht 72.0 in | Wt 228.2 lb

## 2022-10-12 DIAGNOSIS — Z720 Tobacco use: Secondary | ICD-10-CM

## 2022-10-12 DIAGNOSIS — E785 Hyperlipidemia, unspecified: Secondary | ICD-10-CM

## 2022-10-12 DIAGNOSIS — I10 Essential (primary) hypertension: Secondary | ICD-10-CM

## 2022-10-12 DIAGNOSIS — I5022 Chronic systolic (congestive) heart failure: Secondary | ICD-10-CM

## 2022-10-12 DIAGNOSIS — I2510 Atherosclerotic heart disease of native coronary artery without angina pectoris: Secondary | ICD-10-CM

## 2022-10-12 DIAGNOSIS — F1010 Alcohol abuse, uncomplicated: Secondary | ICD-10-CM

## 2022-10-12 DIAGNOSIS — I255 Ischemic cardiomyopathy: Secondary | ICD-10-CM

## 2022-10-12 MED ORDER — SIMVASTATIN 40 MG PO TABS: ORAL_TABLET | ORAL | 3 refills | Status: DC

## 2022-10-12 MED ORDER — LISINOPRIL 20 MG PO TABS: 20.0000 mg | ORAL_TABLET | Freq: Every day | ORAL | 3 refills | Status: DC

## 2022-10-12 MED ORDER — CARVEDILOL 6.25 MG PO TABS: ORAL_TABLET | ORAL | 3 refills | Status: DC

## 2022-10-12 NOTE — Patient Instructions (Signed)
Medication Instructions:  No changes *If you need a refill on your cardiac medications before your next appointment, please call your pharmacy*   Lab Work: Your provider would like for you to have following labs drawn: CBC, CMET, Lipid and Direct LDL.   Please go to the [facility] entrance and check in at the front desk.  You do not need an appointment.  They are open from 7am-6 pm.   If you have labs (blood work) drawn today and your tests are completely normal, you will receive your results only by: MyChart Message (if you have MyChart) OR A paper copy in the mail If you have any lab test that is abnormal or we need to change your treatment, we will call you to review the results.   Testing/Procedures: None ordered   Follow-Up: At [facility], you and your health needs are our priority.  As part of our continuing mission to provide you with exceptional heart care, we have created designated Provider Care Teams.  These Care Teams include your primary Cardiologist (physician) and Advanced Practice Providers (APPs -  Physician Assistants and Nurse Practitioners) who all work together to provide you with the care you need, when you need it.  We recommend signing up for the patient portal called "MyChart".  Sign up information is provided on this After Visit Summary.  MyChart is used to connect with patients for Virtual Visits (Telemedicine).  Patients are able to view lab/test results, encounter notes, upcoming appointments, etc.  Non-urgent messages can be sent to your provider as well.   To learn more about what you can do with MyChart, go to [email or web address].    Your next appointment:   12 month(s)  Provider:   Dr. Kirke Corin

## 2022-10-12 NOTE — Progress Notes (Signed)
Office Visit    Patient Name: Travis Morton Date of Encounter: 10/12/2022  Primary Care Provider:  Patient, No Pcp Per Primary Cardiologist:  Lorine Bears, MD  Chief Complaint    57 year old male with a history of CAD status post non-STEMI and drug-eluting stent placement to LAD in January 2014, hypertension, hyperlipidemia, chronic heart failure with improved EF, ischemic cardiomyopathy, tobacco abuse, and alcohol abuse, who presents for CAD follow-up.  Past Medical History    Past Medical History:  Diagnosis Date   Chronic HFimp(heart failure with improved ejection fraction) (HCC)    a. 05/2012 Echo: EF 30%; b. 09/2012 Echo: EF 55-60%.   Coronary artery disease      a. 05/2012 NSTEMI/Cath/PCI: critical complex LAD stenosis, chronic total occlusion of RCA w/ L-R collaterals, moderate LCx stenosis, and severe LV dysfunction -->DES to LAD (3.0x45mm Promus Premier), PTCA first diagonal;    ETOH abuse    Hyperlipidemia    Hypertension    Ischemic cardiomyopathy    a. 05/2012 Echo: EF 30%, grade 2 diastolic dysfunction, severe anterior and anteroseptal hypokinesis, akinesis of the apical septal and apical inferior walls; b. 09/2012 Echo: EF 55-60%, no rwma, mild MR, nl PASP.   Tobacco abuse    Past Surgical History:  Procedure Laterality Date   CORONARY ANGIOPLASTY WITH STENT PLACEMENT     05/2012 s/p PTCA/DES to LAD, PTCA first diagonal   LEFT HEART CATHETERIZATION WITH CORONARY ANGIOGRAM Bilateral 05/30/2012   Procedure: LEFT HEART CATHETERIZATION WITH CORONARY ANGIOGRAM;  Surgeon: Tonny Bollman, MD;  Location: [facility] CATH LAB;  Service: Cardiovascular;  Laterality: Bilateral;    Allergies  No Known Allergies  History of Present Illness    57 year old male with the above past medical history including CAD, hypertension, hyperlipidemia, chronic heart failure with improved ejection fraction, ischemic cardiomyopathy, tobacco and alcohol abuse.  In January 2014, he was admitted  to [facility] with non-STEMI.  He was transferred to [facility] underwent diagnostic catheterization, which showed severe LAD and diagonal disease as well as a chronic total occlusion of the right coronary artery.  The LAD was treated with a drug-eluting stent while the diagonal was treated with balloon angioplasty.  Echo showed an EF of 30% with grade 2 diastolic dysfunction and severe anterior and anteroseptal hypokinesis, as well as apical septal and apical inferior akinesis.  Follow-up echo in May 2014 showed improvement in ejection fraction at 55-60% without regional wall motion abnormalities.  He has done well over the years and was last seen in clinic in March 2023, at which time he continues to smoke a pack of cigarettes weekly and drink a 12 pack of beer weekly.  Lab work at that time was unremarkable and cholesterol was at goal with an LDL of 69.  Over the past year, he has done well.  He continues to drink about a 12 pack a week and says he only smokes cigarettes when he drinks, averaging about 1 pack/week.  From a cardiac standpoint, he has done well.  He does some light exercises about 20 minutes 3 times per week, usually around his home, without symptoms or limitations.  He denies chest pain, dyspnea, palpitations, PND, orthopnea, dizziness, syncope, edema, or early satiety.  He continues to tolerate medications well.  Home Medications    Current Outpatient Medications  Medication Sig Dispense Refill   aspirin 81 MG tablet Take 81 mg by mouth daily.     carvedilol (COREG) 6.25 MG tablet TAKE 1/2 (ONE-HALF) TABLET  BY MOUTH TWICE DAILY WITH A MEAL 30 tablet 0   lisinopril (ZESTRIL) 20 MG tablet Take 1 tablet (20 mg total) by mouth daily. 14 tablet 0   nitroGLYCERIN (NITROSTAT) 0.4 MG SL tablet Place 1 tablet (0.4 mg total) under the tongue every 5 (five) minutes as needed for chest pain (up to 3 doses). 25 tablet 0   simvastatin (ZOCOR) 40 MG tablet TAKE 1 TABLET BY MOUTH ONCE DAILY AT  6PM 14 tablet 0   No current facility-administered medications for this visit.     Review of Systems    He denies chest pain, palpitations, dyspnea, pnd, orthopnea, n, v, dizziness, syncope, edema, weight gain, or early satiety.  All other systems reviewed and are otherwise negative except as noted above.    Physical Exam    VS:  BP 128/80 (BP Location: Left Arm, Patient Position: Sitting, Cuff Size: Large)   Pulse 68   Ht 6' (1.829 m)   Wt 228 lb 3.2 oz (103.5 kg)   SpO2 98%   BMI 30.95 kg/m  , BMI Body mass index is 30.95 kg/m.     GEN: Well nourished, well developed, in no acute distress. HEENT: normal. Neck: Supple, no JVD, carotid bruits, or masses. Cardiac: RRR, no murmurs, rubs, or gallops. No clubbing, cyanosis, edema.  Radials 2+/PT 2+ and equal bilaterally.  Respiratory:  Respirations regular and unlabored, clear to auscultation bilaterally. GI: Soft, nontender, nondistended, BS + x 4. MS: no deformity or atrophy. Skin: warm and dry, no rash. Neuro:  Strength and sensation are intact. Psych: Normal affect.  Accessory Clinical Findings    ECG personally reviewed by me today -regular sinus rhythm, 68- no acute changes.  Lab Results  Component Value Date   WBC 10.3 12/14/2017   HGB 13.0 12/14/2017   HCT 38.6 12/14/2017   MCV 86 12/14/2017   PLT 304 12/14/2017   Lab Results  Component Value Date   CREATININE 0.90 08/24/2021   BUN 9 08/24/2021   NA 139 08/24/2021   K 4.2 08/24/2021   CL 103 08/24/2021   CO2 24 08/24/2021   Lab Results  Component Value Date   ALT 15 08/24/2021   AST 16 08/24/2021   ALKPHOS 81 08/24/2021   BILITOT 0.4 08/24/2021   Lab Results  Component Value Date   CHOL 131 08/24/2021   HDL 43 08/24/2021   LDLCALC 69 08/24/2021   TRIG 100 08/24/2021   CHOLHDL 3.0 08/24/2021    Assessment & Plan    1.  CAD: Status post non-STEMI in January 2014 with PCI of the LAD and PTCA of the diagonal.  He has a known chronic total  occlusion of the right coronary artery with left-to-right collaterals.  He has done well over the past year without chest pain or dyspnea.  He has been doing some light exercise about 20 minutes 3 times a week and encouraged him to increase this to at least 5 times a week and preferably up to 30 minutes.  He remains on aspirin, beta-blocker, ACE inhibitor, and statin therapy.  Follow-up CBC, complete metabolic panel, and lipids with direct LDL today.  2.  Chronic heart failure with improved ejection fraction/ischemic cardiomyopathy: EF initially 30% the time of his MI in January 2014 with subsequent proven to 55-60% May 2014.  As above, he has done well over the past year without symptoms or limitations.  He remains on beta-blocker, ACE inhibitor, and is euvolemic on examination today.  We discussed the  importance of daily weights, sodium restriction, medication compliance, and symptom reporting and he verbalizes understanding.   3.  Essential hypertension: Well-controlled today on beta-blocker and ACE inhibitor therapy.  4.  Hyperlipidemia: LDL of 69 last year.  Will arrange for lipids, LFTs, and direct LDL today.  Continue statin therapy.  5.  Alcohol use: Continues to drink a 12 pack of beer a week.  Complete cessation advised.  6.  Tobacco abuse: Continues to smoke 1 pack of cigarettes a week.  Says he really only smokes when he is having beer and therefore he is going to focus on limiting his alcohol intake.  Complete cessation advised.  7.  Disposition: Follow-up CBC, complete metabolic panel, lipids, and direct LDL today.  Follow-up in clinic in 1 year or sooner if necessary.  Nicolasa Ducking, NP 10/12/2022, 3:57 PM

## 2022-10-13 NOTE — Addendum Note (Signed)
Addended by: [place] Bilberry D on: 10/13/2022 02:22 PM   Modules accepted: Orders

## 2022-10-14 ENCOUNTER — Other Ambulatory Visit
Admission: RE | Admit: 2022-10-14 | Discharge: 2022-10-14 | Disposition: A | Discharge status: Home / Self Care | Payer: Self-pay | Attending: Nurse Practitioner | Admitting: Nurse Practitioner

## 2022-10-14 DIAGNOSIS — E785 Hyperlipidemia, unspecified: Secondary | ICD-10-CM | POA: Insufficient documentation

## 2022-10-14 DIAGNOSIS — I10 Essential (primary) hypertension: Secondary | ICD-10-CM | POA: Insufficient documentation

## 2022-10-14 LAB — CBC
HCT: 41.2 % (ref 39.0–52.0)
Hemoglobin: 13.7 g/dL (ref 13.0–17.0)
MCH: 28.2 pg (ref 26.0–34.0)
MCHC: 33.3 g/dL (ref 30.0–36.0)
MCV: 84.9 fL (ref 80.0–100.0)
Platelets: 247 10*3/uL (ref 150–400)
RBC: 4.85 MIL/uL (ref 4.22–5.81)
RDW: 13.2 % (ref 11.5–15.5)
WBC: 9.7 10*3/uL (ref 4.0–10.5)
nRBC: 0 % (ref 0.0–0.2)

## 2022-10-14 LAB — COMPREHENSIVE METABOLIC PANEL
ALT: 23 U/L (ref 0–44)
AST: 19 U/L (ref 15–41)
Albumin: 4.2 g/dL (ref 3.5–5.0)
Alkaline Phosphatase: 75 U/L (ref 38–126)
Anion gap: 7 (ref 5–15)
BUN: 17 mg/dL (ref 6–20)
CO2: 24 mmol/L (ref 22–32)
Calcium: 9.2 mg/dL (ref 8.9–10.3)
Chloride: 107 mmol/L (ref 98–111)
Creatinine, Ser: 0.99 mg/dL (ref 0.61–1.24)
GFR, Estimated: >60 mL/min (ref >60)
Glucose, Bld: 115 mg/dL — ABNORMAL HIGH (ref 70–99)
Potassium: 3.8 mmol/L (ref 3.5–5.1)
Sodium: 138 mmol/L (ref 135–145)
Total Bilirubin: 0.7 mg/dL (ref 0.3–1.2)
Total Protein: 7.6 g/dL (ref 6.5–8.1)

## 2022-10-14 LAB — LDL CHOLESTEROL, DIRECT: Direct LDL: 72 mg/dL (ref 0–99)

## 2022-10-14 LAB — LIPID PANEL
Cholesterol: 129 mg/dL (ref 0–200)
HDL: 33 mg/dL — ABNORMAL LOW (ref >40)
LDL Cholesterol: 63 mg/dL (ref 0–99)
Total CHOL/HDL Ratio: 3.9 RATIO
Triglycerides: 165 mg/dL — ABNORMAL HIGH (ref <150)
VLDL: 33 mg/dL (ref 0–40)

## 2022-10-15 ENCOUNTER — Other Ambulatory Visit: Payer: Self-pay | Admitting: Cardiovascular Disease

## 2022-10-17 NOTE — Telephone Encounter (Signed)
Call attempted to speak with patient to find out which pharmacy he would like his prescription sent to. Sent to pharmacy in [place] but now [facility] in [place] is also requesting refill. No answer-unable to leave a message.

## 2022-10-18 ENCOUNTER — Telehealth: Payer: Self-pay | Admitting: Cardiovascular Disease

## 2022-10-18 ENCOUNTER — Encounter: Payer: Self-pay | Admitting: *Deleted

## 2022-10-18 MED ORDER — SIMVASTATIN 40 MG PO TABS: ORAL_TABLET | ORAL | 3 refills | Status: DC

## 2022-10-18 NOTE — Telephone Encounter (Signed)
Pt c/o medication issue:  1. Name of Medication: simvastatin (ZOCOR) 40 MG tablet   2. How are you currently taking this medication (dosage and times per day)?   3. Are you having a reaction (difficulty breathing--STAT)?   4. What is your medication issue? Patient is out of this medication and it was sent to the wrong pharmacy. Requesting it be sent to:   [facility] [address] [place] - [address]

## 2022-10-18 NOTE — Telephone Encounter (Signed)
Refill sent to requested pharmacy for simvastatin 40 mg daily.

## 2022-10-27 ENCOUNTER — Other Ambulatory Visit: Payer: Self-pay | Admitting: Cardiovascular Disease

## 2022-10-31 ENCOUNTER — Other Ambulatory Visit: Payer: Self-pay | Admitting: Cardiovascular Disease

## 2022-10-31 ENCOUNTER — Telehealth: Payer: Self-pay | Admitting: Cardiovascular Disease

## 2022-10-31 MED ORDER — CARVEDILOL 6.25 MG PO TABS: ORAL_TABLET | ORAL | 3 refills | Status: DC

## 2022-10-31 NOTE — Telephone Encounter (Signed)
*  STAT* If patient is at the pharmacy, call can be transferred to refill team.   1. Which medications need to be refilled? (please list name of each medication and dose if known) carvedilol (COREG) 6.25 MG tablet   2. Which pharmacy/location (including street and city if local pharmacy) is medication to be sent to? [facility] [address] [place] - [address]   3. Do they need a 30 day or 90 day supply? 30

## 2022-10-31 NOTE — Telephone Encounter (Signed)
Requested Prescriptions   Signed Prescriptions Disp Refills   carvedilol (COREG) 6.25 MG tablet 30 tablet 3    Sig: TAKE 1/2 (ONE-HALF) TABLET BY MOUTH TWICE DAILY WITH A MEAL    Authorizing Provider: Lorine Bears A    Ordering User: Kendrick Fries

## 2023-01-31 ENCOUNTER — Other Ambulatory Visit: Payer: Self-pay

## 2023-01-31 ENCOUNTER — Telehealth: Payer: Self-pay | Admitting: Cardiovascular Disease

## 2023-01-31 MED ORDER — CARVEDILOL 6.25 MG PO TABS: ORAL_TABLET | ORAL | 3 refills | Status: DC

## 2023-01-31 NOTE — Telephone Encounter (Signed)
Disp Refills Start End   carvedilol (COREG) 6.25 MG tablet 90 tablet 3 01/31/2023 --   Sig: TAKE 1/2 (ONE-HALF) TABLET BY MOUTH TWICE DAILY WITH A MEAL   Sent to pharmacy as: carvedilol (COREG) 6.25 MG tablet   E-Prescribing Status: Receipt confirmed by pharmacy (01/31/2023  2:21 PM EDT)    Pharmacy  [id number] - [place], [place] - [address]

## 2023-01-31 NOTE — Telephone Encounter (Signed)
*  STAT* If patient is at the pharmacy, call can be transferred to refill team.   1. Which medications need to be refilled? (please list name of each medication and dose if known)   carvedilol (COREG) 6.25 MG tablet    4. Which pharmacy/location (including street and city if local pharmacy) is medication to be sent to? [id number] - [place], [place] - [address]     5. Do they need a 30 day or 90 day supply? 90

## 2023-07-21 NOTE — Telephone Encounter (Signed)
Left voicemail to schedule follow up appointment

## 2023-11-15 ENCOUNTER — Telehealth: Payer: Self-pay | Admitting: Cardiovascular Disease

## 2023-11-15 MED ORDER — LISINOPRIL 20 MG PO TABS: 20.0000 mg | ORAL_TABLET | Freq: Every day | ORAL | 0 refills | Status: DC

## 2023-11-15 MED ORDER — SIMVASTATIN 40 MG PO TABS: ORAL_TABLET | ORAL | 0 refills | Status: DC

## 2023-11-15 NOTE — Telephone Encounter (Signed)
*  STAT* If patient is at the pharmacy, call can be transferred to refill team.   1. Which medications need to be refilled? (please list name of each medication and dose if known) lisinopril  (ZESTRIL ) 20 MG tablet   simvastatin  (ZOCOR ) 40 MG tablet     2. Would you like to learn more about the convenience, safety, & potential cost savings by using the [facility]?   3. Are you open to using the [facility] (Type [facility]. ).   4. Which pharmacy/location (including street and city if local pharmacy) is medication to be sent to? [facility] [address] [place] - [address]    5. Do they need a 30 day or 90 day supply? 90 day

## 2023-11-15 NOTE — Telephone Encounter (Signed)
 Pt's medications were sent to pt's pharmacy as requested. Confirmation received.

## 2024-01-30 ENCOUNTER — Ambulatory Visit: Payer: Self-pay | Attending: Cardiovascular Disease | Admitting: Cardiovascular Disease

## 2024-01-30 NOTE — Progress Notes (Deleted)
 Cardiology Office Note   Date:  01/30/2024   ID:  Travis Morton, DOB 08/01/1965, MRN [id number]  PCP:  Patient, No Pcp Per  Cardiologist:  PIERRETTE Deatrice Cage, MD   No chief complaint on file.     History of Present Illness: Travis Morton is a 58 y.o. male who presents for *** 58 year old male who is here today for a followup visit regarding coronary artery disease and ischemic cardiomyopathy.  He was admitted on 05/30/2012 to [facility] and subsequently transferred to [facility] for management of non-ST segment elevation myocardial infarction. [facility] severe [facility]AD and diagonal disease as well as a chronic total occlusion of the right coronary artery. The LAD was treated with a drug-eluting stent while the diagonal was treated with balloon angioplasty. Followup echocardiogram while hospitalized showed an EF of 30%.  He had a repeat echocardiogram in May of 2014 which showed normal LV systolic function. He initially reported aspirin allergy but then it turned out that he was mistaken for another medication. He has been on low-dose aspirin now for more than a year with no reported side effects. She has been doing well and denies any chest pain or shortness of breath. He quit smoking after his myocardial infarction. However, he smokes few cigarettes when he drinks a beer.   Past Medical History:  Diagnosis Date   Chronic HFimp(heart failure with improved ejection fraction) (HCC)    a. 05/2012 Echo: EF 30%; b. 09/2012 Echo: EF 55-60%.   Coronary artery disease      a. 05/2012 NSTEMI/Cath/PCI: critical complex LAD stenosis, chronic total occlusion of RCA w/ L-R collaterals, moderate LCx stenosis, and severe LV dysfunction -->DES to LAD (3.0x23mm Promus Premier), PTCA first diagonal;    ETOH abuse    Hyperlipidemia    Hypertension    Ischemic cardiomyopathy    a. 05/2012 Echo: EF 30%, grade 2 diastolic dysfunction, severe anterior  and anteroseptal hypokinesis, akinesis of the apical septal and apical inferior walls; b. 09/2012 Echo: EF 55-60%, no rwma, mild MR, nl PASP.   Tobacco abuse     Past Surgical History:  Procedure Laterality Date   CORONARY ANGIOPLASTY WITH STENT PLACEMENT     05/2012 s/p PTCA/DES to LAD, PTCA first diagonal   LEFT HEART CATHETERIZATION WITH CORONARY ANGIOGRAM Bilateral 05/30/2012   Procedure: LEFT HEART CATHETERIZATION WITH CORONARY ANGIOGRAM;  Surgeon: Ozell Fell, MD;  Location: [facility] C[facility] LAB;  Service: Cardiovascular;  Laterality: Bilateral;     Current Outpatient Medications  Medication Sig Dispense Refill   aspirin 81 MG tablet Take 81 mg by mouth daily.     carvedilol  (COREG ) 6.25 MG tablet TAKE 1/2 (ONE-HALF) TABLET BY MOUTH TWICE DAILY WITH A MEAL 90 tablet 3   lisinopril  (ZESTRIL ) 20 MG tablet Take 1 tablet (20 mg total) by mouth daily. 90 tablet 0   nitroGLYCERIN  (NITROSTAT ) 0.4 MG SL tablet Place 1 tablet (0.4 mg total) under the tongue every 5 (five) minutes as needed for chest pain (up to 3 doses). 25 tablet 0   simvastatin  (ZOCOR ) 40 MG tablet TAKE 1 TABLET BY MOUTH ONCE DAILY AT 6PM 90 tablet 0   No current facility-administered medications for this visit.    Allergies:   Patient has no known allergies.    Social History:  The patient  reports that he has been smoking cigarettes. He started smoking about 35 years ago. He has a 12 pack-year smoking history. He has never  used smokeless tobacco. He reports current alcohol use of about 12.0 standard drinks of alcohol per week. He reports current drug use. Drug: Cocaine.   Family History:  The patient's ***family history includes Hypertension in his father and mother.    ROS:  Please see the history of present illness.   Otherwise, review of systems are positive for {NONE DEFAULTED:18576}.   All other systems are reviewed and negative.    PHYSICAL EXAM: VS:  There were no vitals taken for this visit. , BMI There is no  height or weight on file to calculate BMI. GEN: Well nourished, well developed, in no acute distress  HEENT: normal  Neck: no JVD, carotid bruits, or masses Cardiac: ***RRR; no murmurs, rubs, or gallops,no edema  Respiratory:  clear to auscultation bilaterally, normal work of breathing GI: soft, nontender, nondistended, + BS MS: no deformity or atrophy  Skin: warm and dry, no rash Neuro:  Strength and sensation are intact Psych: euthymic mood, full affect   EKG:  EKG {ACTION; IS/IS WNU:[id number]} ordered today. The ekg ordered today demonstrates ***   Recent Labs: No results found for requested labs within last 365 days.    Lipid Panel    Component Value Date/Time   CHOL 129 10/14/2022 1536   CHOL 131 08/24/2021 1023   TRIG 165 (H) 10/14/2022 1536   HDL 33 (L) 10/14/2022 1536   HDL 43 08/24/2021 1023   CHOLHDL 3.9 10/14/2022 1536   VLDL 33 10/14/2022 1536   LDLCALC 63 10/14/2022 1536   LDLCALC 69 08/24/2021 1023   LDLDIRECT 72 10/14/2022 1536      Wt Readings from Last 3 Encounters:  10/12/22 228 lb 3.2 oz (103.5 kg)  08/24/21 222 lb (100.7 kg)  12/26/19 (!) 233 lb 6 oz (105.9 kg)      Other studies Reviewed: Additional studies/ records that were reviewed today include: ***. Review of the above records demonstrates: ***      No data to display            ASSESSMENT AND PLAN:  1.  ***    Disposition:   FU with *** in {gen number [id number]} {Days to years:10300}  Signed,  Deatrice Cage, MD  01/30/2024 2:09 PM    [facility]

## 2024-02-05 ENCOUNTER — Other Ambulatory Visit: Payer: Self-pay | Admitting: Cardiovascular Disease

## 2024-02-22 ENCOUNTER — Telehealth: Payer: Self-pay | Admitting: Cardiovascular Disease

## 2024-02-22 MED ORDER — SIMVASTATIN 40 MG PO TABS: ORAL_TABLET | ORAL | 0 refills | Status: DC

## 2024-02-22 NOTE — Telephone Encounter (Signed)
 RX sent in

## 2024-02-22 NOTE — Telephone Encounter (Signed)
*  STAT* If patient is at the pharmacy, call can be transferred to refill team.   1. Which medications need to be refilled? (please list name of each medication and dose if known) simvastatin  (ZOCOR ) 40 MG tablet   2. Which pharmacy/location (including street and city if local pharmacy) is medication to be sent to?  [facility] [address] [place] - [address]    3. Do they need a 30 day or 90 day supply? 90   Patient has appt on 11/4

## 2024-03-11 ENCOUNTER — Other Ambulatory Visit: Payer: Self-pay | Admitting: Cardiovascular Disease

## 2024-03-12 ENCOUNTER — Telehealth: Payer: Self-pay | Admitting: Cardiovascular Disease

## 2024-03-12 MED ORDER — LISINOPRIL 20 MG PO TABS
20.0000 mg | ORAL_TABLET | Freq: Every day | ORAL | 0 refills | Status: AC
Start: 2024-03-12 — End: ?

## 2024-03-12 NOTE — Telephone Encounter (Signed)
 RX sent in

## 2024-03-12 NOTE — Telephone Encounter (Signed)
*  STAT* If patient is at the pharmacy, call can be transferred to refill team.   1. Which medications need to be refilled? (please list name of each medication and dose if known)  lisinopril  (ZESTRIL ) 20 MG tablet   2. Which pharmacy/location (including street and city if local pharmacy) is medication to be sent to? [facility] [address] [place] - [address]  3. Do they need a 30 day or 90 day supply?  90 day supply  Patient says he is completely out of medication.

## 2024-04-02 ENCOUNTER — Ambulatory Visit: Payer: Self-pay | Admitting: Cardiovascular Disease

## 2024-05-07 ENCOUNTER — Other Ambulatory Visit: Payer: Self-pay | Admitting: Cardiovascular Disease

## 2024-05-13 ENCOUNTER — Other Ambulatory Visit: Payer: Self-pay | Admitting: Cardiovascular Disease

## 2024-05-15 ENCOUNTER — Other Ambulatory Visit: Payer: Self-pay | Admitting: Cardiovascular Disease

## 2024-05-28 ENCOUNTER — Other Ambulatory Visit: Payer: Self-pay | Admitting: Cardiovascular Disease

## 2024-05-30 ENCOUNTER — Other Ambulatory Visit: Payer: Self-pay | Admitting: Cardiovascular Disease

## 2024-06-05 ENCOUNTER — Ambulatory Visit: Payer: Self-pay | Attending: Cardiovascular Disease | Admitting: Cardiovascular Disease

## 2024-06-05 ENCOUNTER — Encounter: Payer: Self-pay | Admitting: Cardiovascular Disease

## 2024-06-05 VITALS — BP 110/70 | HR 64 | Ht 72.0 in | Wt 224.0 lb

## 2024-06-05 DIAGNOSIS — E785 Hyperlipidemia, unspecified: Secondary | ICD-10-CM | POA: Diagnosis not present

## 2024-06-05 DIAGNOSIS — Z720 Tobacco use: Secondary | ICD-10-CM | POA: Diagnosis not present

## 2024-06-05 DIAGNOSIS — I739 Peripheral vascular disease, unspecified: Secondary | ICD-10-CM

## 2024-06-05 DIAGNOSIS — I10 Essential (primary) hypertension: Secondary | ICD-10-CM | POA: Diagnosis not present

## 2024-06-05 DIAGNOSIS — I255 Ischemic cardiomyopathy: Secondary | ICD-10-CM | POA: Diagnosis not present

## 2024-06-05 DIAGNOSIS — I5022 Chronic systolic (congestive) heart failure: Secondary | ICD-10-CM | POA: Diagnosis not present

## 2024-06-05 DIAGNOSIS — I2510 Atherosclerotic heart disease of native coronary artery without angina pectoris: Secondary | ICD-10-CM | POA: Diagnosis not present

## 2024-06-05 MED ORDER — ATORVASTATIN CALCIUM 40 MG PO TABS: 40.0000 mg | ORAL_TABLET | Freq: Every day | ORAL | 3 refills | Status: AC

## 2024-06-05 NOTE — Patient Instructions (Signed)
 Medication Instructions:  STOP the Simvastatin   START Atorvastatin  40 mg once daily *If you need a refill on your cardiac medications before your next appointment, please call your pharmacy*  Lab Work: Your provider would like for you to have the following labs today: CBC, CMET, Lipid and PSA  If you have labs (blood work) drawn today and your tests are completely normal, you will receive your results only by: MyChart Message (if you have MyChart) OR A paper copy in the mail If you have any lab test that is abnormal or we need to change your treatment, we will call you to review the results.  Testing/Procedures: Your physician has requested that you have an ankle brachial index (ABI). During this test an ultrasound and blood pressure cuff are used to evaluate the arteries that supply the arms and legs with blood.  Allow thirty minutes for this exam.  There are no restrictions or special instructions.  This will take place at [address] [facility]) [address], [place] [address]  Please note: We ask at that you not bring children with you during ultrasound (echo/ vascular) testing. Due to room size and safety concerns, children are not allowed in the ultrasound rooms during exams. Our front office staff cannot provide observation of children in our lobby area while testing is being conducted. An adult accompanying a patient to their appointment will only be allowed in the ultrasound room at the discretion of the ultrasound technician under special circumstances. We apologize for any inconvenience.   Follow-Up: At [facility], you and your health needs are our priority.  As part of our continuing mission to provide you with exceptional heart care, our providers are all part of one team.  This team includes your primary Cardiologist (physician) and Advanced Practice Providers or APPs (Physician Assistants and Nurse Practitioners) who all work together to provide you with  the care you need, when you need it.  Your next appointment:   6 month(s)  Provider:   You may see Deatrice Cage, MD or one of the following Advanced Practice Providers on your designated Care Team:   Lonni Meager, NP Lesley Maffucci, PA-C Bernardino Bring, PA-C Cadence Rocky, PA-C Tylene Lunch, NP Barnie Hila, NP    We recommend signing up for the patient portal called MyChart.  Sign up information is provided on this After Visit Summary.  MyChart is used to connect with patients for Virtual Visits (Telemedicine).  Patients are able to view lab/test results, encounter notes, upcoming appointments, etc.  Non-urgent messages can be sent to your provider as well.   To learn more about what you can do with MyChart, go to [email or web address].   Other Instructions Please call [facility] at [phone] for help finding a primary care physician.   Or you may call [facility] at [phone]  [phone]  Rollene Northern, FNP Randine Cheeks, MD Allena Hamilton, MD Verneita Kettering, MD  Or you may call [facility] at [phone]  -[address]  Jon Eva, MD Manuelita Flatness, PA-C Nancyann Perry, MD [place], [place] Kelly Alvah Cedar, FNP Rockie Agent, MD

## 2024-06-05 NOTE — Progress Notes (Signed)
 "    Cardiology Office Note   Date:  06/05/2024   ID:  Travis Morton, DOB Jul 30, 1965, MRN [id number]  PCP:  Patient, No Pcp Per  Cardiologist:   Deatrice Cage, MD   Chief Complaint  Patient presents with   Follow-up    OD 12 month f/u no complaints today. Meds reviewed verbally with pt.      History of Present Illness: Travis Morton is a 59 y.o. male who presents for a follow-up visit regarding coronary artery disease and heart failure with improved ejection fraction. He has past medical history including CAD, hypertension, hyperlipidemia, chronic heart failure with improved ejection fraction, ischemic cardiomyopathy, tobacco and alcohol abuse.  He presented in 2014 with non-STEMI.  Cardiac catheterization showed severe LAD and diagonal disease as well as a chronic total occlusion of the right coronary artery. The LAD was treated with a drug-eluting stent while the diagonal was treated with balloon angioplasty. Echo showed an EF of 30% with grade 2 diastolic dysfunction and severe anterior and anteroseptal hypokinesis, as well as apical septal and apical inferior akinesis. Follow-up echo in May 2014 showed improvement in ejection fraction at 55-60% without regional wall motion abnormalities.   He has been doing well with no chest pain, shortness of breath or palpitations.  He takes his medications regularly.  He cut down tobacco use to 5 cigarettes a week but continues to drink a 12 pack of beer on the weekends.  He does not have a primary care physician. He does complain of left calf discomfort after walking a long distance.   Past Medical History:  Diagnosis Date   Chronic HFimp(heart failure with improved ejection fraction) (HCC)    a. 05/2012 Echo: EF 30%; b. 09/2012 Echo: EF 55-60%.   Coronary artery disease      a. 05/2012 NSTEMI/Cath/PCI: critical complex LAD stenosis, chronic total occlusion of RCA w/ L-R collaterals, moderate LCx stenosis, and severe LV dysfunction -->DES  to LAD (3.0x86mm Promus Premier), PTCA first diagonal;    ETOH abuse    Hyperlipidemia    Hypertension    Ischemic cardiomyopathy    a. 05/2012 Echo: EF 30%, grade 2 diastolic dysfunction, severe anterior and anteroseptal hypokinesis, akinesis of the apical septal and apical inferior walls; b. 09/2012 Echo: EF 55-60%, no rwma, mild MR, nl PASP.   Tobacco abuse     Past Surgical History:  Procedure Laterality Date   CORONARY ANGIOPLASTY WITH STENT PLACEMENT     05/2012 s/p PTCA/DES to LAD, PTCA first diagonal   LEFT HEART CATHETERIZATION WITH CORONARY ANGIOGRAM Bilateral 05/30/2012   Procedure: LEFT HEART CATHETERIZATION WITH CORONARY ANGIOGRAM;  Surgeon: Ozell Fell, MD;  Location: [facility] CATH LAB;  Service: Cardiovascular;  Laterality: Bilateral;     Current Outpatient Medications  Medication Sig Dispense Refill   aspirin 81 MG tablet Take 81 mg by mouth daily.     carvedilol  (COREG ) 6.25 MG tablet TAKE 1/2 (ONE-HALF) TABLET BY MOUTH TWICE DAILY WITH A MEAL 90 tablet 0   lisinopril  (ZESTRIL ) 20 MG tablet Take 1 tablet (20 mg total) by mouth daily. 30 tablet 0   nitroGLYCERIN  (NITROSTAT ) 0.4 MG SL tablet Place 1 tablet (0.4 mg total) under the tongue every 5 (five) minutes as needed for chest pain (up to 3 doses). 25 tablet 0   simvastatin  (ZOCOR ) 40 MG tablet TAKE 1 TABLET BY MOUTH ONCE DAILY AT  6PM 90 tablet 0   lisinopril  (ZESTRIL ) 20 MG tablet TAKE 1 TABLET BY MOUTH  ONCE DAILY . APPOINTMENT REQUIRED FOR FUTURE REFILLS (Patient not taking: Reported on 06/05/2024) 90 tablet 0   No current facility-administered medications for this visit.    Allergies:   Patient has no known allergies.    Social History:  The patient  reports that he has been smoking cigarettes. He started smoking about 36 years ago. He has a 12 pack-year smoking history. He has never used smokeless tobacco. He reports current alcohol use of about 12.0 standard drinks of alcohol per week. He reports current drug use. Drug:  Cocaine.   Family History:  The patient's family history includes Hypertension in his father and mother.    ROS:  Please see the history of present illness.   Otherwise, review of systems are positive for none.   All other systems are reviewed and negative.    PHYSICAL EXAM: VS:  BP 110/70 (BP Location: Left Arm, Patient Position: Sitting, Cuff Size: Large)   Pulse 64   Ht 6' (1.829 m)   Wt 224 lb (101.6 kg)   SpO2 99%   BMI 30.38 kg/m  , BMI Body mass index is 30.38 kg/m. GEN: Well nourished, well developed, in no acute distress  HEENT: normal  Neck: no JVD, carotid bruits, or masses Cardiac: RRR; no murmurs, rubs, or gallops,no edema  Respiratory:  clear to auscultation bilaterally, normal work of breathing GI: soft, nontender, nondistended, + BS MS: no deformity or atrophy  Skin: warm and dry, no rash Neuro:  Strength and sensation are intact Psych: euthymic mood, full affect Vascular: Distal pulses are normal on the right and mildly diminished on the left.  Dorsalis pedis is not palpable on the left  EKG:  EKG is ordered today. The ekg ordered today demonstrates :  Sinus rhythm with Sinus arrhythmia When compared with ECG of 31-May-2012 07:15, Criteria for Septal infarct are no longer Present T wave inversion no longer evident in Anterolateral leads QT has shortened    Recent Labs: No results found for requested labs within last 365 days.    Lipid Panel    Component Value Date/Time   CHOL 129 10/14/2022 1536   CHOL 131 08/24/2021 1023   TRIG 165 (H) 10/14/2022 1536   HDL 33 (L) 10/14/2022 1536   HDL 43 08/24/2021 1023   CHOLHDL 3.9 10/14/2022 1536   VLDL 33 10/14/2022 1536   LDLCALC 63 10/14/2022 1536   LDLCALC 69 08/24/2021 1023   LDLDIRECT 72 10/14/2022 1536      Wt Readings from Last 3 Encounters:  06/05/24 224 lb (101.6 kg)  10/12/22 228 lb 3.2 oz (103.5 kg)  08/24/21 222 lb (100.7 kg)          No data to display            ASSESSMENT  AND PLAN:  1.  Coronary artery disease involving native coronary arteries without angina: He is doing well overall with no anginal symptoms.  Continue medical therapy.  2.  Left calf claudication: Seems to be mild overall but does have abnormal pulses on the left.  I requested an ABI.  3.  Hyperlipidemia: Most recent lipid profile showed an LDL of 72.  I elected to switch simvastatin  to atorvastatin  40 mg once daily.  Will update his labs today.  4.  Essential hypertension: Blood pressure is controlled on current medications.  We refilled his medications.  5.  History of heart failure with improved ejection fraction: No evidence of volume overload.  6.  Tobacco use: He  cut down but did not quit yet.  I discussed with him the importance of complete cessation.  7.  Excessive alcohol use.  I advised him to decrease  8.  Health maintenance: He has no primary care physician and we have provided him with list of primary care physicians taking new patients.  I discussed with him the importance of regular follow-up and the need for health maintenance.  Will check his PSA today with his labs.    Disposition:   FU with me in 6 months  Signed,  Deatrice Cage, MD  06/05/2024 9:04 AM    [facility] "

## 2024-06-06 ENCOUNTER — Ambulatory Visit: Payer: Self-pay | Admitting: *Deleted

## 2024-06-06 LAB — CBC
Hematocrit: 43.1 % (ref 37.5–51.0)
Hemoglobin: 13.7 g/dL (ref 13.0–17.7)
MCH: 28.6 pg (ref 26.6–33.0)
MCHC: 31.8 g/dL (ref 31.5–35.7)
MCV: 90 fL (ref 79–97)
Platelets: 211 x10E3/uL (ref 150–450)
RBC: 4.79 x10E6/uL (ref 4.14–5.80)
RDW: 13.7 % (ref 11.6–15.4)
WBC: 9.5 x10E3/uL (ref 3.4–10.8)

## 2024-06-06 LAB — LIPID PANEL
Chol/HDL Ratio: 3.3 ratio (ref 0.0–5.0)
Cholesterol, Total: 150 mg/dL (ref 100–199)
HDL: 46 mg/dL
LDL Chol Calc (NIH): 78 mg/dL (ref 0–99)
Triglycerides: 148 mg/dL (ref 0–149)
VLDL Cholesterol Cal: 26 mg/dL (ref 5–40)

## 2024-06-06 LAB — PSA: Prostate Specific Ag, Serum: 0.6 ng/mL (ref 0.0–4.0)

## 2024-06-06 LAB — COMPREHENSIVE METABOLIC PANEL WITH GFR
ALT: 15 IU/L (ref 0–44)
AST: 19 IU/L (ref 0–40)
Albumin: 4.2 g/dL (ref 3.8–4.9)
Alkaline Phosphatase: 75 IU/L (ref 47–123)
BUN/Creatinine Ratio: 14 (ref 9–20)
BUN: 14 mg/dL (ref 6–24)
Bilirubin Total: 0.3 mg/dL (ref 0.0–1.2)
CO2: 25 mmol/L (ref 20–29)
Calcium: 9.4 mg/dL (ref 8.7–10.2)
Chloride: 102 mmol/L (ref 96–106)
Creatinine, Ser: 1.03 mg/dL (ref 0.76–1.27)
Globulin, Total: 2.7 g/dL (ref 1.5–4.5)
Glucose: 90 mg/dL (ref 70–99)
Potassium: 4.4 mmol/L (ref 3.5–5.2)
Sodium: 142 mmol/L (ref 134–144)
Total Protein: 6.9 g/dL (ref 6.0–8.5)
eGFR: 85 mL/min/1.73

## 2024-06-24 ENCOUNTER — Ambulatory Visit: Attending: Cardiovascular Disease
